# Patient Record
Sex: Male | Born: 1990
Health system: Southern US, Community
[De-identification: ages and names within clinical notes are randomized; demographics above are authoritative.]

## PROBLEM LIST (undated history)

## (undated) DIAGNOSIS — E782 Mixed hyperlipidemia: Secondary | ICD-10-CM

## (undated) DIAGNOSIS — G8929 Other chronic pain: Secondary | ICD-10-CM

## (undated) DIAGNOSIS — J90 Pleural effusion, not elsewhere classified: Secondary | ICD-10-CM

## (undated) DIAGNOSIS — I10 Essential (primary) hypertension: Secondary | ICD-10-CM

## (undated) DIAGNOSIS — R000 Tachycardia, unspecified: Secondary | ICD-10-CM

## (undated) DIAGNOSIS — T7840XA Allergy, unspecified, initial encounter: Secondary | ICD-10-CM

## (undated) DIAGNOSIS — M25512 Pain in left shoulder: Secondary | ICD-10-CM

## (undated) DIAGNOSIS — G4730 Sleep apnea, unspecified: Secondary | ICD-10-CM

## (undated) DIAGNOSIS — M545 Low back pain: Secondary | ICD-10-CM

## (undated) DIAGNOSIS — F909 Attention-deficit hyperactivity disorder, unspecified type: Secondary | ICD-10-CM

## (undated) DIAGNOSIS — E669 Obesity, unspecified: Secondary | ICD-10-CM

## (undated) HISTORY — DX: Allergy, unspecified, initial encounter: T78.40XA

## (undated) HISTORY — DX: Pleural effusion, not elsewhere classified: J90

## (undated) HISTORY — PX: FRACTURE SURGERY: SHX138

## (undated) HISTORY — DX: Low back pain: M54.5

## (undated) HISTORY — DX: Other chronic pain: G89.29

## (undated) HISTORY — DX: Pain in left shoulder: M25.512

## (undated) HISTORY — DX: Mixed hyperlipidemia: E78.2

## (undated) HISTORY — DX: Sleep apnea, unspecified: G47.30

## (undated) HISTORY — DX: Essential (primary) hypertension: I10

## (undated) HISTORY — DX: Obesity, unspecified: E66.9

## (undated) HISTORY — PX: HAND SURGERY: SHX662

## (undated) HISTORY — DX: Tachycardia, unspecified: R00.0

## (undated) HISTORY — DX: Attention-deficit hyperactivity disorder, unspecified type: F90.9

---
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
## 2017-04-13 ENCOUNTER — Encounter (INDEPENDENT_AMBULATORY_CARE_PROVIDER_SITE_OTHER): Payer: Self-pay

## 2017-04-13 ENCOUNTER — Ambulatory Visit (INDEPENDENT_AMBULATORY_CARE_PROVIDER_SITE_OTHER): Payer: 59 | Admitting: Physician Assistant

## 2017-04-13 ENCOUNTER — Encounter: Payer: Self-pay | Admitting: Physician Assistant

## 2017-04-13 VITALS — BP 137/86 | HR 84 | Ht 67.0 in | Wt 199.0 lb

## 2017-04-13 DIAGNOSIS — F909 Attention-deficit hyperactivity disorder, unspecified type: Secondary | ICD-10-CM

## 2017-04-13 DIAGNOSIS — Z0000 Encounter for general adult medical examination without abnormal findings: Secondary | ICD-10-CM

## 2017-04-13 DIAGNOSIS — I10 Essential (primary) hypertension: Secondary | ICD-10-CM

## 2017-04-13 DIAGNOSIS — B079 Viral wart, unspecified: Secondary | ICD-10-CM

## 2017-04-13 DIAGNOSIS — H029 Unspecified disorder of eyelid: Secondary | ICD-10-CM

## 2017-04-13 DIAGNOSIS — E6609 Other obesity due to excess calories: Secondary | ICD-10-CM | POA: Diagnosis not present

## 2017-04-13 DIAGNOSIS — Z7689 Persons encountering health services in other specified circumstances: Secondary | ICD-10-CM

## 2017-04-13 DIAGNOSIS — Z6831 Body mass index (BMI) 31.0-31.9, adult: Secondary | ICD-10-CM

## 2017-04-13 DIAGNOSIS — Z131 Encounter for screening for diabetes mellitus: Secondary | ICD-10-CM

## 2017-04-13 DIAGNOSIS — Z789 Other specified health status: Secondary | ICD-10-CM

## 2017-04-13 DIAGNOSIS — Z113 Encounter for screening for infections with a predominantly sexual mode of transmission: Secondary | ICD-10-CM | POA: Diagnosis not present

## 2017-04-13 DIAGNOSIS — Z130 Encounter for screening for diseases of the blood and blood-forming organs and certain disorders involving the immune mechanism: Secondary | ICD-10-CM | POA: Diagnosis not present

## 2017-04-13 DIAGNOSIS — Z6830 Body mass index (BMI) 30.0-30.9, adult: Secondary | ICD-10-CM

## 2017-04-13 DIAGNOSIS — Z973 Presence of spectacles and contact lenses: Secondary | ICD-10-CM

## 2017-04-13 DIAGNOSIS — Z13220 Encounter for screening for lipoid disorders: Secondary | ICD-10-CM

## 2017-04-13 HISTORY — DX: Attention-deficit hyperactivity disorder, unspecified type: F90.9

## 2017-04-13 HISTORY — DX: Essential (primary) hypertension: I10

## 2017-04-13 LAB — CBC
HEMATOCRIT: 43.3 % (ref 38.5–50.0)
HEMOGLOBIN: 14.4 g/dL (ref 13.2–17.1)
MCH: 29.8 pg (ref 27.0–33.0)
MCHC: 33.3 g/dL (ref 32.0–36.0)
MCV: 89.5 fL (ref 80.0–100.0)
MPV: 9.2 fL (ref 7.5–12.5)
Platelets: 272 10*3/uL (ref 140–400)
RBC: 4.84 MIL/uL (ref 4.20–5.80)
RDW: 13.4 % (ref 11.0–15.0)
WBC: 4.2 10*3/uL (ref 3.8–10.8)

## 2017-04-13 MED ORDER — AMPHETAMINE-DEXTROAMPHETAMINE 20 MG PO TABS: ORAL_TABLET | ORAL | 0 refills | Status: DC

## 2017-04-13 NOTE — Patient Instructions (Signed)
For your blood pressure: - Limit salt to <1500 mg/day - Follow DASH eating plan - regular cardiovascular exercise (below) - limit alcohol to 2 standard drinks per day - avoid tobacco products - weight loss: 7% of current body weight - Follow-up in 6 months  Physical Activity Recommendations for modifying lipids and lowering blood pressure Engage in aerobic physical activity to reduce LDL-cholesterol, non-HDL-cholesterol, and blood pressure  Frequency: 3-4 sessions per week  Intensity: moderate to vigorous  Duration: 40 minutes on average  Physical Activity Recommendations for secondary prevention 1. Aerobic exercise  Frequency: 3-5 sessions per week  Intensity: 50-80% capacity  Duration: 20 - 60 minutes  Examples: walking, treadmill, cycling, rowing, stair climbing, and arm/leg ergometry  2. Resistance exercise  Frequency: 2-3 sessions per week  Intensity: 10-15 repetitions/set to moderate fatigue  Duration: 1-3 sets of 8-10 upper and lower body exercises  Examples: calisthenics, elastic bands, cuff/hand weights, dumbbels, free weights, wall pulleys, and weight machines  Heart-Healthy Lifestyle  Eating a diet rich in vegetables, fruits and whole grains: also includes low-fat dairy products, poultry, fish, legumes, and nuts; limit intake of sweets, sugar-sweetened beverages and red meats  Getting regular exercise  Maintaining a healthy weight  Not smoking or getting help quitting  Staying on top of your health; for some people, lifestyle changes alone may not be enough to prevent a heart attack or stroke. In these cases, taking a statin at the right dose will most likely be necessary

## 2017-04-13 NOTE — Progress Notes (Signed)
HPI:                                                                Stephen Steele is a 26 y.o. male who presents to [facility]: Primary Care Sports Medicine today to establish care / annual physical exam  Current Concerns include warts   Patient reports multiple warts of his right thumb. States they have been present for years and are spreading. He has tried salicylic acid without success. Denies pain, warmth, erythema.  ADHD: doing well with Adderrall immediate release bid. Denies headache, palpitations.  Health Maintenance Health Maintenance  Topic Date Due  . HIV Screening  09/25/2006  . TETANUS/TDAP  09/25/2010  . INFLUENZA VACCINE  03/21/2017    Past Medical History:  Diagnosis Date  . Adult ADHD 04/13/2017  . Allergy   . Chronic left shoulder pain    Past Surgical History:  Procedure Laterality Date  . HAND SURGERY Right    Social History  Substance Use Topics  . Smoking status: Former Smoker    Years: 2.00    Types: Cigarettes    Quit date: 04/13/2012  . Smokeless tobacco: Never Used  . Alcohol use 4.2 oz/week    7 Glasses of wine per week   family history includes Cancer in his paternal grandfather; Depression in his mother and sister; Stroke in his paternal grandfather.  ROS: Review of Systems  Constitutional: Negative.   HENT: Negative.   Eyes: Negative.   Respiratory: Negative.   Cardiovascular: Negative.   Gastrointestinal: Negative.   Genitourinary: Negative.   Musculoskeletal: Positive for myalgias.  Skin:       + viral warts  Endo/Heme/Allergies: Positive for environmental allergies.  Psychiatric/Behavioral: The patient has insomnia.      Medications: Current Outpatient Prescriptions  Medication Sig Dispense Refill  . [START ON 04/23/2017] amphetamine-dextroamphetamine (ADDERALL) 20 MG tablet Take 2 tablets in the morning and 1 tablet at lunch 90 tablet 0  . tiZANidine (ZANAFLEX) 4 MG tablet Take 1 tablet by mouth daily as  needed.    Melene Muller ON 05/23/2017] amphetamine-dextroamphetamine (ADDERALL) 20 MG tablet Take 2 tablets in the morning and 1 tablet at lunch 90 tablet 0  . [START ON 06/22/2017] amphetamine-dextroamphetamine (ADDERALL) 20 MG tablet Take 2 tablets QAM and 1 tablet at lunch 90 tablet 0   No current facility-administered medications for this visit.    No Known Allergies   Objective:  BP 137/86   Pulse 84   Ht 5\' 7"  (1.702 m)   Wt 199 lb (90.3 kg)   BMI 31.17 kg/m  General Appearance:  Alert, cooperative, no distress, appropriate for age                            Head:  Normocephalic, without obvious abnormality                             Eyes:  PERRL, EOM's intact, conjunctiva and cornea clear, wearing contact lenses                             Ears:  Right TM pearly  gray color and semitransparent, left TM slightly retracted, external ear canals normal, both ears                            Nose:  Nares symmetrical                          Throat:  Lips, tongue, and mucosa are moist, pink, and intact; good dentition                             Neck:  Supple; symmetrical, trachea midline, no adenopathy; thyroid: no enlargement, symmetric, no tenderness/mass/nodules                             Back:  Symmetrical, no curvature, ROM normal                                        Lungs:  Clear to auscultation bilaterally, respirations unlabored                             Heart:  regular rate & normal rhythm, S1 and S2 normal, no murmurs, rubs, or gallops                     Abdomen:  Soft, non-tender, no mass or organomegaly              Genitourinary:  deferred         Musculoskeletal:  Tone and strength strong and symmetrical, all extremities; no joint pain or edema, normal gait and station                                  Lymphatic:  No adenopathy             Skin/Hair/Nails:  Skin warm, dry and intact, no rashes or abnormal dyspigmentation on limited exam, right thumb with multiple viral  warts at the distal phalanx, DIP, and lateral nail fold; right upper eye lid with approx 3mm erythematous papule                   Neurologic:  Alert and oriented x3, no cranial nerve deficits, DTR's intact, sensation grossly intact, normal gait and station, no tremor    No results found for this or any previous visit (from the past 72 hour(s)). No results found.  Depression screen PHQ 2/9 04/13/2017  Decreased Interest 1  Down, Depressed, Hopeless 0  PHQ - 2 Score 1     Assessment and Plan: 26 y.o. male with   1. Encounter to establish care - reviewed PMH, PSH, PFH   2. Encounter for annual physical exam - Tdap UTD per patient - negative PHQ2 - CBC - Comprehensive metabolic panel - Lipid Panel w/reflex Direct LDL  3. Routine screening for STI (sexually transmitted infection) - Hepatitis C antibody - HIV antibody - GC/Chlamydia Probe Amp - RPR  4. Class 1 obesity due to excess calories without serious comorbidity with body mass index (BMI) of 31.0 to 31.9 in adult - counseled on heart healthy diet and exercise  5. Screening for diabetes  mellitus - Comprehensive metabolic panel  6. Screening for lipid disorders - Lipid Panel w/reflex Direct LDL  7. Screening, anemia, deficiency, iron - CBC  8. Viral wart on right thumb Cryotherapy template Procedure: Cryodestruction of: viral warts of right thumb Consent obtained and verified. Time-out conducted. Noted no overlying erythema, induration, or other signs of local infection. Completed without difficulty using Cryo-Gun. Advised to call if fevers/chills, erythema, induration, drainage, or persistent bleeding.   9. Hypertension, unspecified type BP Readings from Last 3 Encounters:  04/13/17 137/86  - therapeutic lifestyle changes - closely monitor at ADHD follow-ups   10. Adult ADHD - patient brought in pill bottle which shows fill date of 03/23/17.  - reviewed office policy on controlled substances - 3 month  supply provided beginning 04/23/17 - amphetamine-dextroamphetamine (ADDERALL) 20 MG tablet; Take 2 tablets in the morning and 1 tablet at lunch  Dispense: 90 tablet; Refill: 0 - amphetamine-dextroamphetamine (ADDERALL) 20 MG tablet; Take 2 tablets in the morning and 1 tablet at lunch  Dispense: 90 tablet; Refill: 0 - amphetamine-dextroamphetamine (ADDERALL) 20 MG tablet; Take 2 tablets QAM and 1 tablet at lunch  Dispense: 90 tablet; Refill: 0  11. Wears contact lenses   12. Lesion of upper right eyelid - referral to dermatology  Patient education and anticipatory guidance given Patient agrees with treatment plan Follow-up in 2 weeks for cryotherapy or sooner as needed  Levonne Hubert PA-C

## 2017-04-14 LAB — HIV ANTIBODY (ROUTINE TESTING W REFLEX): HIV: NONREACTIVE

## 2017-04-14 LAB — LIPID PANEL W/REFLEX DIRECT LDL
CHOLESTEROL: 214 mg/dL — AB (ref <200)
HDL: 34 mg/dL — AB (ref >40)
LDL-Cholesterol: 162 mg/dL — ABNORMAL HIGH
Non-HDL Cholesterol (Calc): 180 mg/dL — ABNORMAL HIGH (ref <130)
TRIGLYCERIDES: 81 mg/dL (ref <150)
Total CHOL/HDL Ratio: 6.3 Ratio — ABNORMAL HIGH (ref <5.0)

## 2017-04-14 LAB — COMPREHENSIVE METABOLIC PANEL
ALBUMIN: 4.8 g/dL (ref 3.6–5.1)
ALK PHOS: 62 U/L (ref 40–115)
ALT: 25 U/L (ref 9–46)
AST: 16 U/L (ref 10–40)
BILIRUBIN TOTAL: 0.5 mg/dL (ref 0.2–1.2)
BUN: 15 mg/dL (ref 7–25)
CALCIUM: 9.4 mg/dL (ref 8.6–10.3)
CO2: 21 mmol/L (ref 20–32)
Chloride: 103 mmol/L (ref 98–110)
Creat: 1.22 mg/dL (ref 0.60–1.35)
Glucose, Bld: 87 mg/dL (ref 65–99)
Potassium: 4 mmol/L (ref 3.5–5.3)
Sodium: 139 mmol/L (ref 135–146)
Total Protein: 6.7 g/dL (ref 6.1–8.1)

## 2017-04-14 LAB — RPR

## 2017-04-14 LAB — GC/CHLAMYDIA PROBE AMP
CT PROBE, AMP APTIMA: NOT DETECTED
GC PROBE AMP APTIMA: NOT DETECTED

## 2017-04-14 LAB — HEPATITIS C ANTIBODY: HCV Ab: NONREACTIVE

## 2017-04-16 ENCOUNTER — Encounter: Payer: Self-pay | Admitting: Physician Assistant

## 2017-04-16 DIAGNOSIS — E782 Mixed hyperlipidemia: Secondary | ICD-10-CM | POA: Insufficient documentation

## 2017-04-16 HISTORY — DX: Mixed hyperlipidemia: E78.2

## 2017-04-16 NOTE — Progress Notes (Signed)
LDL cholesterol is high. Recommend low cholesterol diet and regular cardiovascular exercise for the next 3 months. Recheck fasting lipid panel in 3 months.  Other labs look good - negative STI testing - normal blood counts - no evidence of diabetes

## 2017-04-30 MED FILL — DEXTROAMP-AMPHETAMIN 20 MG: 20 | 30 days supply | Qty: 90 | Fill #0

## 2017-05-04 ENCOUNTER — Ambulatory Visit: Payer: 59 | Admitting: Physician Assistant

## 2017-05-15 ENCOUNTER — Telehealth: Payer: Self-pay | Admitting: *Deleted

## 2017-05-15 NOTE — Telephone Encounter (Signed)
Need to know what patient has tried and failed in the past. Even with previous providers progress notes I only see adderall

## 2017-05-17 ENCOUNTER — Telehealth: Payer: Self-pay

## 2017-05-17 NOTE — Telephone Encounter (Signed)
Pre Authorization was sent to Cover My Med[employer].(Key: [id number])

## 2017-05-22 NOTE — Telephone Encounter (Addendum)
Response from the insurance company in regard PA is :the plan only allows for 2 tablets per day. Three tablets a day exceeds their quantity limit rule. In order to have the request approved the provider needs to submit information showing that the larger quantity is clinically necessary for the care and include a statement that the lower quantity was ineffective.  In his progress notes with Dr. Daphene Calamity I see that he has always been on 3 tablets per day on at the 20 mg dose.

## 2017-05-25 NOTE — Telephone Encounter (Signed)
Appeal sent.

## 2017-05-28 NOTE — Telephone Encounter (Addendum)
Appeal has been approved. Notified [facility].

## 2017-06-01 MED FILL — AMPHETAMINE SALTS 20 MG TAB: 20 | 30 days supply | Qty: 90 | Fill #0

## 2017-06-29 MED FILL — DEXTROAMP-AMPHETAMIN 20 MG: 20 | 30 days supply | Qty: 90 | Fill #0

## 2017-07-24 ENCOUNTER — Ambulatory Visit (INDEPENDENT_AMBULATORY_CARE_PROVIDER_SITE_OTHER): Payer: 59 | Admitting: Physician Assistant

## 2017-07-24 ENCOUNTER — Encounter: Payer: Self-pay | Admitting: Physician Assistant

## 2017-07-24 VITALS — BP 117/81 | HR 72 | Wt 199.0 lb

## 2017-07-24 DIAGNOSIS — M545 Low back pain, unspecified: Secondary | ICD-10-CM | POA: Insufficient documentation

## 2017-07-24 DIAGNOSIS — F909 Attention-deficit hyperactivity disorder, unspecified type: Secondary | ICD-10-CM

## 2017-07-24 DIAGNOSIS — R030 Elevated blood-pressure reading, without diagnosis of hypertension: Secondary | ICD-10-CM | POA: Insufficient documentation

## 2017-07-24 DIAGNOSIS — B079 Viral wart, unspecified: Secondary | ICD-10-CM

## 2017-07-24 DIAGNOSIS — Z79899 Other long term (current) drug therapy: Secondary | ICD-10-CM | POA: Diagnosis not present

## 2017-07-24 HISTORY — DX: Low back pain, unspecified: M54.50

## 2017-07-24 MED ORDER — TIZANIDINE HCL 4 MG PO TABS: 4.0000 mg | ORAL_TABLET | Freq: Every day | ORAL | 3 refills | Status: DC | PRN

## 2017-07-24 MED ORDER — AMPHETAMINE-DEXTROAMPHETAMINE 20 MG PO TABS: ORAL_TABLET | ORAL | 0 refills | Status: DC

## 2017-07-24 MED FILL — tiZANidine HCL 4 MG TABS: 4 | 30 days supply | Qty: 30 | Fill #0

## 2017-07-24 NOTE — Patient Instructions (Addendum)
For low back pain: - rehab exercises daily - avoid rest and being sedentary. Try to do as many of your daily activities as possible. Avoid painful activities - practice good body mechanics - TENS unit (muscle stimulation) - muscle relaxer as needed for spasm - Aleve 1-2 tabs twice a day as needed for pain - Ice or Heat (whichever feels better), x 20 minutes 3-4 times daily  For warts: - wait 1 week. Then apply OTC salicylic acid daily - return in 3-4 weeks for repeat cryo as needed

## 2017-07-24 NOTE — Progress Notes (Signed)
HPI:                                                                Stephen Steele is a 26 y.o. male who [facility] today for ADHD follow-up / cryodestruction of warts  Adult ADHD: taking Adderall 60 mg daily in divided doses without difficulty. Denies headache, palpitations, abnormal heart beats, or insomnia.  Warts: underwent cryotherapy 3 months ago in the office. Tolerated procedure well. Reports warts seemed to be improving, but still present. Denies pain, warmth, erythema, or drainage.  LBP: patient reports long-standing history of intermittent bilateral low back pain. Pain is moderate, intermittent. Reports it is worse with standing. Denies history of trauma/injury. He has been taking Zanaflex as needed, which works well for him. Reports he went to PT and chiro in the past. Denies paresthesias, weakness or sciatica. No saddle numbness. No bowel or bladder dysfunction. No constitutional symptoms.  Past Medical History:  Diagnosis Date  . Adult ADHD 04/13/2017  . Allergy   . Chronic left shoulder pain   . Hypertension 04/13/2017  . Mixed hyperlipidemia 04/16/2017  . Obesity    Past Surgical History:  Procedure Laterality Date  . HAND SURGERY Right    Social History   Tobacco Use  . Smoking status: Former Smoker    Years: 2.00    Types: Cigarettes    Last attempt to quit: 04/13/2012    Years since quitting: 5.2  . Smokeless tobacco: Never Used  Substance Use Topics  . Alcohol use: Yes    Alcohol/week: 4.2 oz    Types: 7 Glasses of wine per week   family history includes Cancer in his paternal grandfather; Depression in his mother and sister; Stroke in his paternal grandfather.  ROS: negative except as noted in the HPI  Medications: Current Outpatient Medications  Medication Sig Dispense Refill  . amphetamine-dextroamphetamine (ADDERALL) 20 MG tablet Take 2 tablets in the morning and 1 tablet at lunch 90  tablet 0  . [START ON 08/23/2017] amphetamine-dextroamphetamine (ADDERALL) 20 MG tablet Take 2 tablets in the morning and 1 tablet at lunch 90 tablet 0  . [START ON 09/22/2017] amphetamine-dextroamphetamine (ADDERALL) 20 MG tablet Take 2 tablets QAM and 1 tablet at lunch 90 tablet 0  . tiZANidine (ZANAFLEX) 4 MG tablet Take 1 tablet (4 mg total) by mouth daily as needed. 30 tablet 3   No current facility-administered medications for this visit.    No Known Allergies     Objective:  BP 117/81   Pulse 72   Wt 199 lb (90.3 kg)   BMI 31.17 kg/m  Gen:  alert, not ill-appearing, no distress, appropriate for age, obese male HEENT: head normocephalic without obvious abnormality, conjunctiva and cornea clear, trachea midline Pulm: Normal work of breathing, normal phonation, clear to auscultation bilaterally, no wheezes, rales or rhonchi CV: Normal rate, regular rhythm, s1 and s2 distinct, no murmurs, clicks or rubs  Neuro: alert and oriented x 3, no tremor MSK: extremities atraumatic, normal gait and station Skin: palmar aspect of right distal first phalanx and lateral nail fold with viral wart, right second phalanx at the lateral PIP there is a viral wart Psych: well-groomed, cooperative, good eye contact, euthymic mood, affect mood-congruent,  speech is articulate, and thought processes clear and goal-directed  [facility] 04/13/2017  Decreased Interest 1  Down, Depressed, Hopeless 0  PHQ - 2 Score 1     No results found for this or any previous visit (from the past 72 hour(s)). No results found.    Assessment and Plan: 26 y.o. male with   1. Encounter for medication management in attention deficit hyperactivity disorder (ADHD)   2. Adult ADHD - checked NCCSRS, no red flags. Follow-up in 3 months - amphetamine-dextroamphetamine (ADDERALL) 20 MG tablet; Take 2 tablets in the morning and 1 tablet at lunch  Dispense: 90 tablet; Refill: 0 - amphetamine-dextroamphetamine  (ADDERALL) 20 MG tablet; Take 2 tablets in the morning and 1 tablet at lunch  Dispense: 90 tablet; Refill: 0 - amphetamine-dextroamphetamine (ADDERALL) 20 MG tablet; Take 2 tablets QAM and 1 tablet at lunch  Dispense: 90 tablet; Refill: 0  3. Viral wart on right thumb Procedure: Cryodestruction of: 3 viral warts, right 1st and 2nd digits Consent obtained and verified. Time-out conducted. Noted no overlying erythema, induration, or other signs of local infection. Completed without difficulty using Cryo-Gun. Advised to call if fevers/chills, erythema, induration, drainage, or persistent bleeding. Advised to start daily salicylic acid treatments after 1 week. Return in 3-4 weeks for repeat cryo as needed.  4. Acute bilateral low back pain without sciatica - rehab exercises daily - avoid rest and being sedentary. Try to do as many of your daily activities as possible. Avoid painful activities - practice good body mechanics - TENS unit (muscle stimulation) - muscle relaxer as needed for spasm - Aleve 1-2 tabs twice a day as needed for pain - Ice or Heat (whichever feels better), x 20 minutes 3-4 times daily  5. Elevated blood pressure reading BP Readings from Last 3 Encounters:  07/24/17 117/81  04/13/17 137/86  - initial reading in office hypertensive, diastolic BP has been elevated at last 2 visits - active surveillance - follow-up in 3 months   Patient education and anticipatory guidance given Patient agrees with treatment plan Follow-up in 3 months or sooner as needed if symptoms worsen or fail to improve  Levonne Hubertharley E. Cummings PA-C

## 2017-07-27 MED FILL — DEXTROAMP-AMPHETAMIN 20 MG: 20 | 30 days supply | Qty: 90 | Fill #0

## 2017-08-08 DIAGNOSIS — B078 Other viral warts: Secondary | ICD-10-CM | POA: Diagnosis not present

## 2017-08-28 MED FILL — AMPHETAMINE-DEXTRO 20MG: 20 | 30 days supply | Qty: 90 | Fill #0

## 2017-10-01 MED FILL — tiZANidine HCL 4 MG TABS: 4 | 30 days supply | Qty: 30 | Fill #1

## 2017-10-01 MED FILL — AMPHETAMINE-DEXTRO 20MG: 20 | 30 days supply | Qty: 90 | Fill #0

## 2017-10-17 ENCOUNTER — Telehealth: Payer: 59 | Admitting: Family

## 2017-10-17 DIAGNOSIS — R6889 Other general symptoms and signs: Secondary | ICD-10-CM | POA: Diagnosis not present

## 2017-10-17 MED ORDER — OSELTAMIVIR PHOSPHATE 75 MG PO CAPS: 75.0000 mg | ORAL_CAPSULE | Freq: Two times a day (BID) | ORAL | 0 refills | Status: DC

## 2017-10-17 MED FILL — OSELTAMIVIR PHOSPHATE 75 MG: 75 | 5 days supply | Qty: 10 | Fill #0

## 2017-10-17 NOTE — Progress Notes (Signed)

## 2017-10-23 ENCOUNTER — Ambulatory Visit (INDEPENDENT_AMBULATORY_CARE_PROVIDER_SITE_OTHER): Payer: 59

## 2017-10-23 ENCOUNTER — Ambulatory Visit (INDEPENDENT_AMBULATORY_CARE_PROVIDER_SITE_OTHER): Payer: 59 | Admitting: Physician Assistant

## 2017-10-23 ENCOUNTER — Encounter: Payer: Self-pay | Admitting: Physician Assistant

## 2017-10-23 VITALS — BP 134/92 | HR 91 | Temp 98.4°F | Wt 200.0 lb

## 2017-10-23 DIAGNOSIS — J90 Pleural effusion, not elsewhere classified: Secondary | ICD-10-CM

## 2017-10-23 DIAGNOSIS — R05 Cough: Secondary | ICD-10-CM

## 2017-10-23 DIAGNOSIS — I10 Essential (primary) hypertension: Secondary | ICD-10-CM

## 2017-10-23 DIAGNOSIS — R059 Cough, unspecified: Secondary | ICD-10-CM

## 2017-10-23 DIAGNOSIS — T43605A Adverse effect of unspecified psychostimulants, initial encounter: Secondary | ICD-10-CM | POA: Diagnosis not present

## 2017-10-23 DIAGNOSIS — F909 Attention-deficit hyperactivity disorder, unspecified type: Secondary | ICD-10-CM | POA: Diagnosis not present

## 2017-10-23 NOTE — Patient Instructions (Addendum)
For your blood pressure: - Goal <130/80 - monitor and log blood pressures at home - check around the same time each day in a relaxed setting - reduce morning dose of Adderall to 1.5 tablets (30 mg) and afternoon dose to (10 mg) - limit caffeine 1 standard drink per day - start weaning off of e-cigarettes  - Limit salt to <2000 mg/day - Follow DASH eating plan - limit alcohol to 2 standard drinks per day for men and 1 per day for women - avoid tobacco products - weight loss: 7% of current body weight - send me your BP readings in 1 week

## 2017-10-23 NOTE — Progress Notes (Signed)
HPI:                                                                Stephen Steele is a 27 y.o. male who presents to [facility]: Primary Care Sports Medicine today for medication management  Current concerns: tachycardia  He was diagnosed with flu-like illness on 10/17/17 and treated with Tamiflu at e-visit. Has not had a fever since Friday (4 days ago). Reports when he went to work on Sunday he had lightheadedness, fatigue, and chest tightness. He checked his vitals and reports HR of 127-135 at rest and elevated BP of 154/99 and SpO2 was 100%. He had taken his Adderall that morning, but held his second dose. He also drinks multiple caffeinated beverages and uses e-cigarettes at least once per day He is feeling improved today. Denies palpitations, irregular heart beats, or chest pain. Denies hemoptysis, dyspnea, shortness of breath.  ADHD: currently takes 40 mg of adderall every morning and 20 mg in the afternoon as needed. States XR did not kick in quickly enough.   Depression screen PHQ 2/9 04/13/2017  Decreased Interest 1  Down, Depressed, Hopeless 0  PHQ - 2 Score 1    No flowsheet data found.    Past Medical History:  Diagnosis Date  . Adult ADHD 04/13/2017  . Allergy   . Chronic left shoulder pain   . Hypertension 04/13/2017  . Mixed hyperlipidemia 04/16/2017  . Obesity    Past Surgical History:  Procedure Laterality Date  . HAND SURGERY Right    Social History   Tobacco Use  . Smoking status: Former Smoker    Years: 2.00    Types: Cigarettes    Last attempt to quit: 04/13/2012    Years since quitting: 5.5  . Smokeless tobacco: Never Used  Substance Use Topics  . Alcohol use: Yes    Alcohol/week: 4.2 oz    Types: 7 Glasses of wine per week   family history includes Cancer in his paternal grandfather; Depression in his mother and sister; Stroke in his paternal grandfather.    ROS: negative except as noted in the HPI  Medications: Current  Outpatient Medications  Medication Sig Dispense Refill  . amphetamine-dextroamphetamine (ADDERALL) 20 MG tablet Take 2 tablets in the morning and 1 tablet at lunch 90 tablet 0  . amphetamine-dextroamphetamine (ADDERALL) 20 MG tablet Take 2 tablets in the morning and 1 tablet at lunch 90 tablet 0  . amphetamine-dextroamphetamine (ADDERALL) 20 MG tablet Take 2 tablets QAM and 1 tablet at lunch 90 tablet 0   No current facility-administered medications for this visit.    No Known Allergies     Objective:  BP (!) 134/92   Pulse 91   Temp 98.4 F (36.9 C)   Wt 200 lb (90.7 kg)   SpO2 99%   BMI 31.32 kg/m  Gen:  alert, not ill-appearing, no distress, appropriate for age, obese male HEENT: head normocephalic without obvious abnormality, conjunctiva and cornea clear, trachea midline Pulm: Normal work of breathing, normal phonation, clear to auscultation bilaterally, no wheezes, rales or rhonchi CV: mildly tachycardic, regular rhythm, s1 and s2 distinct, no murmurs, clicks or rubs  Neuro: alert and oriented x 3, no tremor MSK: extremities atraumatic, normal gait and station Skin:  intact, no rashes on exposed skin, no jaundice, no cyanosis Psych: well-groomed, cooperative, good eye contact, euthymic mood, affect mood-congruent, speech is articulate, and thought processes clear and goal-directed    No results found for this or any previous visit (from the past 72 hour(s)). Dg Chest 2 View  Result Date: 10/23/2017 CLINICAL DATA:  Cough with recent flu syndrome 1 week ago. Former smoker. EXAM: CHEST - 2 VIEW COMPARISON:  None in PACs FINDINGS: The lungs are mildly hyperinflated. There is no focal infiltrate. There is a trace of pleural fluid on the right. The heart and pulmonary vascularity are normal. The mediastinum is normal in width. The bony thorax is unremarkable. IMPRESSION: Tiny right-sided pleural effusion.  No acute pneumonia. Electronically Signed   By: David  SwazilandJordan M.D.   On:  10/23/2017 16:58      Assessment and Plan: 27 y.o. male with   1. Adult ADHD - holding his Adderall refill due to tachycardia and hypertension. We will likely need to reduce his TDD to 40 mg and if BP remains elevated he will need to either stop stimulant or start antihypertensive medication. He prefers to start antihypertensive because he needs stimulant to function at his job  2. Cough - afebrile, mild tachycardia, SpO2 99-100%. He is feeling improved, but concerned about possible pneumonia. - DG Chest 2 View to assess for infiltrate  3. Hypertension goal BP (blood pressure) < 130/80, Tachycardia BP Readings from Last 3 Encounters:  10/23/17 (!) 134/92  07/24/17 117/81  04/13/17 137/86  - we discussed that patient's symptoms are likely the result of multiple stimulants in the setting of a febrile illness (adderall, nicotine and caffeine). I encouraged him to limit caffeine to 1 standard drink per day and start weaning off of e-cigarettes. He will reduce his Adderall dose to 1.5 tablets QAM and 1/2 tablet in the afternoon. No refills until we have follow-up BP readings - he will monitor BP at home and send readings via MyChart (cannot follow-up in person due to work schedule) - counseled on therapeutic lifestyle changes  4. Adverse effect of central nervous system stimulant, initial encounter - tachycardia due to combination of Adderall, nicotine and caffeine   Patient education and anticipatory guidance given Patient agrees with treatment plan Follow-up as needed if symptoms worsen or fail to improve  Levonne Hubertharley E. Christiana Gurevich PA-C

## 2017-10-25 ENCOUNTER — Encounter: Payer: Self-pay | Admitting: Physician Assistant

## 2017-10-25 ENCOUNTER — Other Ambulatory Visit: Payer: Self-pay | Admitting: Physician Assistant

## 2017-10-25 DIAGNOSIS — R05 Cough: Secondary | ICD-10-CM

## 2017-10-25 DIAGNOSIS — J90 Pleural effusion, not elsewhere classified: Secondary | ICD-10-CM

## 2017-10-25 DIAGNOSIS — R053 Chronic cough: Secondary | ICD-10-CM

## 2017-10-25 HISTORY — DX: Pleural effusion, not elsewhere classified: J90

## 2017-10-25 NOTE — Progress Notes (Signed)
Stephen Steele,  Your x-ray shows a tiny bit of fluid on your lung. I would recommend we recheck this in 1-2 weeks to assure it has resolved. I also would like to check a d-dimer to rule out a blood clot in your lung. I think you are low risk, but with your history of chest tightness, cough, and elevated heart rate we should make sure.  Vinetta Bergamoharley

## 2017-10-25 NOTE — Addendum Note (Signed)
Addended by: Gena FrayUMMINGS, Trena Dunavan E on: 10/25/2017 12:58 PM   Modules accepted: Orders

## 2017-10-31 ENCOUNTER — Ambulatory Visit (INDEPENDENT_AMBULATORY_CARE_PROVIDER_SITE_OTHER): Payer: 59

## 2017-10-31 ENCOUNTER — Other Ambulatory Visit: Payer: Self-pay | Admitting: Physician Assistant

## 2017-10-31 DIAGNOSIS — J90 Pleural effusion, not elsewhere classified: Secondary | ICD-10-CM

## 2017-10-31 DIAGNOSIS — I10 Essential (primary) hypertension: Secondary | ICD-10-CM

## 2017-10-31 DIAGNOSIS — J111 Influenza due to unidentified influenza virus with other respiratory manifestations: Secondary | ICD-10-CM

## 2017-10-31 DIAGNOSIS — R918 Other nonspecific abnormal finding of lung field: Secondary | ICD-10-CM

## 2017-10-31 DIAGNOSIS — F909 Attention-deficit hyperactivity disorder, unspecified type: Secondary | ICD-10-CM

## 2017-10-31 MED ORDER — LISINOPRIL 10 MG PO TABS: 10.0000 mg | ORAL_TABLET | Freq: Every day | ORAL | 3 refills | Status: DC

## 2017-10-31 MED ORDER — IOPAMIDOL (ISOVUE-300) INJECTION 61%
100.0000 mL | Freq: Once | INTRAVENOUS | Status: AC | PRN
  Administered 2017-10-31: 80 mL via INTRAVENOUS

## 2017-10-31 MED ORDER — AMPHETAMINE-DEXTROAMPHETAMINE 20 MG PO TABS: 20.0000 mg | ORAL_TABLET | Freq: Two times a day (BID) | ORAL | 0 refills | Status: DC

## 2017-10-31 MED FILL — LISINOPRIL 10 MG TABS: 10 | 30 days supply | Qty: 30 | Fill #0

## 2017-10-31 MED FILL — DEXTROAMP-AMPHETAMIN 20 MG: 20 | 30 days supply | Qty: 60 | Fill #0

## 2017-10-31 NOTE — Assessment & Plan Note (Signed)
Starting Lisinopril 10 mg and decreasing TDD of Adderall to 40 mg (20 mg IR bid) 10/31/17

## 2017-11-01 NOTE — Progress Notes (Signed)
Alex,  Your CT scan looks good. There are 2 tiny pulmonary nodules that are considered benign and do not require follow-up imaging.  I hope you are feeling better! Vinetta Bergamoharley

## 2017-11-08 ENCOUNTER — Encounter: Payer: Self-pay | Admitting: Physician Assistant

## 2017-11-08 DIAGNOSIS — R42 Dizziness and giddiness: Secondary | ICD-10-CM | POA: Insufficient documentation

## 2017-11-09 ENCOUNTER — Ambulatory Visit: Payer: Self-pay | Admitting: Physician Assistant

## 2017-11-29 ENCOUNTER — Other Ambulatory Visit: Payer: Self-pay | Admitting: Physician Assistant

## 2017-11-29 DIAGNOSIS — F909 Attention-deficit hyperactivity disorder, unspecified type: Secondary | ICD-10-CM

## 2017-11-29 MED FILL — LISINOPRIL 10 MG TABS: 10 | 30 days supply | Qty: 30 | Fill #1

## 2017-11-29 MED FILL — tiZANidine HCL 4 MG TABS: 4 | 30 days supply | Qty: 30 | Fill #2

## 2017-11-30 NOTE — Telephone Encounter (Signed)
[facility] sent request for pt's Adderral 20mg , 1 tab BID.  Last RX sent in 10-31-17 for #[id number].  Please review and send if appropriate.   Thanks!

## 2017-12-04 MED ORDER — AMPHETAMINE-DEXTROAMPHETAMINE 20 MG PO TABS: 20.0000 mg | ORAL_TABLET | Freq: Two times a day (BID) | ORAL | 0 refills | Status: DC

## 2017-12-05 MED FILL — DEXTROAMP-AMPHETAMIN 20 MG: 20 | 30 days supply | Qty: 60 | Fill #0

## 2017-12-31 MED FILL — LISINOPRIL 10 MG TABS: 10 | 30 days supply | Qty: 30 | Fill #2

## 2018-01-02 ENCOUNTER — Ambulatory Visit (INDEPENDENT_AMBULATORY_CARE_PROVIDER_SITE_OTHER): Payer: 59 | Admitting: Physician Assistant

## 2018-01-02 ENCOUNTER — Encounter: Payer: Self-pay | Admitting: Physician Assistant

## 2018-01-02 VITALS — BP 133/88 | HR 108 | Wt 197.0 lb

## 2018-01-02 DIAGNOSIS — Z6831 Body mass index (BMI) 31.0-31.9, adult: Secondary | ICD-10-CM

## 2018-01-02 DIAGNOSIS — F909 Attention-deficit hyperactivity disorder, unspecified type: Secondary | ICD-10-CM | POA: Diagnosis not present

## 2018-01-02 DIAGNOSIS — Z130 Encounter for screening for diseases of the blood and blood-forming organs and certain disorders involving the immune mechanism: Secondary | ICD-10-CM | POA: Diagnosis not present

## 2018-01-02 DIAGNOSIS — Z1329 Encounter for screening for other suspected endocrine disorder: Secondary | ICD-10-CM

## 2018-01-02 DIAGNOSIS — R0683 Snoring: Secondary | ICD-10-CM | POA: Diagnosis not present

## 2018-01-02 DIAGNOSIS — G4719 Other hypersomnia: Secondary | ICD-10-CM | POA: Diagnosis not present

## 2018-01-02 DIAGNOSIS — Z0000 Encounter for general adult medical examination without abnormal findings: Secondary | ICD-10-CM

## 2018-01-02 DIAGNOSIS — E6609 Other obesity due to excess calories: Secondary | ICD-10-CM | POA: Diagnosis not present

## 2018-01-02 DIAGNOSIS — R000 Tachycardia, unspecified: Secondary | ICD-10-CM | POA: Diagnosis not present

## 2018-01-02 DIAGNOSIS — Z6830 Body mass index (BMI) 30.0-30.9, adult: Secondary | ICD-10-CM

## 2018-01-02 DIAGNOSIS — I10 Essential (primary) hypertension: Secondary | ICD-10-CM

## 2018-01-02 DIAGNOSIS — E782 Mixed hyperlipidemia: Secondary | ICD-10-CM

## 2018-01-02 HISTORY — DX: Tachycardia, unspecified: R00.0

## 2018-01-02 MED ORDER — AMPHETAMINE-DEXTROAMPHETAMINE 20 MG PO TABS: 20.0000 mg | ORAL_TABLET | Freq: Two times a day (BID) | ORAL | 0 refills | Status: DC

## 2018-01-02 MED ORDER — LISINOPRIL 20 MG PO TABS: 20.0000 mg | ORAL_TABLET | Freq: Every day | ORAL | 1 refills | Status: DC

## 2018-01-02 NOTE — Patient Instructions (Addendum)
For your blood pressure: - Goal <130/80 - increase Lisinopril to 20 mg daily - reduce Adderall to 20 mg twice a day. Continue to avoid alcohol - monitor and log blood pressures at home - check around the same time each day in a relaxed setting - Limit salt to <2000 mg/day - Follow DASH eating plan - limit alcohol to 2 standard drinks per day for men and 1 per day for women - avoid tobacco products - weight loss: 7% of current body weight - follow-up every 6 months for your blood pressure    Physical Activity Recommendations for modifying lipids and lowering blood pressure Engage in aerobic physical activity to reduce LDL-cholesterol, non-HDL-cholesterol, and blood pressure  Frequency: 3-4 sessions per week  Intensity: moderate to vigorous  Duration: 40 minutes on average  Physical Activity Recommendations for secondary prevention 1. Aerobic exercise  Frequency: 3-5 sessions per week  Intensity: 50-80% capacity  Duration: 20 - 60 minutes  Examples: walking, treadmill, cycling, rowing, stair climbing, and arm/leg ergometry  2. Resistance exercise  Frequency: 2-3 sessions per week  Intensity: 10-15 repetitions/set to moderate fatigue  Duration: 1-3 sets of 8-10 upper and lower body exercises  Examples: calisthenics, elastic bands, cuff/hand weights, dumbbels, free weights, wall pulleys, and weight machines  Heart-Healthy Lifestyle  Eating a diet rich in vegetables, fruits and whole grains: also includes low-fat dairy products, poultry, fish, legumes, and nuts; limit intake of sweets, sugar-sweetened beverages and red meats  Getting regular exercise  Maintaining a healthy weight  Not smoking or getting help quitting  Staying on top of your health; for some people, lifestyle changes alone may not be enough to prevent a heart attack or stroke. In these cases, taking a statin at the right dose will most likely be necessary

## 2018-01-02 NOTE — Progress Notes (Signed)
HPI:                                                                Stephen Steele is a 27 y.o. male who presents to [facility]: Primary Care Sports Medicine today for annual physical exam  HTN: taking Lisinopril 10 mg daily. Compliant with medications. Checks BP's at home. BP range 120's/80's, HR 90's. Denies vision change, headache, chest pain with exertion, orthopnea, lightheadedness, syncope and edema. Risk factors include: male sex, HLD  Also reports excessive daytime sleepiness, snoring, restlessness, non-restorative sleep. This has been going on for years, unchanged. He usually sleeps 8 hours nightly. Denies difficulty falling asleep, nighttime awakening, or premature awakening.  Depression screen PHQ 2/9 04/13/2017  Decreased Interest 1  Down, Depressed, Hopeless 0  PHQ - 2 Score 1    No flowsheet data found.    Past Medical History:  Diagnosis Date  . Adult ADHD 04/13/2017  . Allergy   . Chronic left shoulder pain   . Hypertension 04/13/2017  . Mixed hyperlipidemia 04/16/2017  . Obesity   . Pleural effusion on right 10/25/2017   Past Surgical History:  Procedure Laterality Date  . HAND SURGERY Right    Social History   Tobacco Use  . Smoking status: Former Smoker    Years: 2.00    Types: Cigarettes    Last attempt to quit: 04/13/2012    Years since quitting: 5.7  . Smokeless tobacco: Never Used  Substance Use Topics  . Alcohol use: Yes    Alcohol/week: 4.2 oz    Types: 7 Glasses of wine per week   family history includes Cancer in his paternal grandfather; Depression in his mother and sister; Heart attack in his paternal grandfather; Hypercholesterolemia in his father; Stroke in his paternal grandfather.    ROS: Review of Systems  Constitutional: Positive for malaise/fatigue.  All other systems reviewed and are negative.    Medications: Current Outpatient Medications  Medication Sig Dispense Refill  . amphetamine-dextroamphetamine  (ADDERALL) 20 MG tablet Take 1 tablet (20 mg total) by mouth 2 (two) times daily. 60 tablet 0  . lisinopril (PRINIVIL,ZESTRIL) 10 MG tablet Take 1 tablet (10 mg total) by mouth daily. 30 tablet 3   No current facility-administered medications for this visit.    No Known Allergies     Objective:  BP 133/88   Pulse (!) 108   Wt 197 lb (89.4 kg)   BMI 30.85 kg/m  General Appearance:  Alert, cooperative, no distress, appropriate for age, obese male                            Head:  Normocephalic, without obvious abnormality                             Eyes:  PERRL, EOM's intact, conjunctiva and cornea clear, wearing contact lenses                             Ears:  TM pearly gray color and semitransparent on left side, right TM with scarring and small amount of cerumen sitting on the TM, external  ear canals normal, both ears                            Nose:  Nares symmetrical                          Throat:  Lips, tongue, and mucosa are moist, pink, and intact; good dentition                             Neck:  Supple; symmetrical, trachea midline, no adenopathy; thyroid: no enlargement, symmetric, no tenderness/mass/nodules                             Back:  Symmetrical, no curvature, ROM normal                           Lungs:  Clear to auscultation bilaterally, respirations unlabored                             Heart:  Tachycardic rate & normal rhythm, S1 and S2 normal, no murmurs, rubs, or gallops                     Abdomen:  Soft, non-tender, no mass or organomegaly              Genitourinary:  deferred         Musculoskeletal:  Tone and strength strong and symmetrical, all extremities; no joint pain or edema, normal gait and station                                     Lymphatic:  No adenopathy             Skin/Hair/Nails:  Skin warm, dry and intact, no rashes or abnormal dyspigmentation on limited exam                   Neurologic:  Alert and oriented x3, no cranial nerve deficits,  DTR's intact, sensation grossly intact, normal gait and station, no tremor Psych: well-groomed, cooperative, good eye contact, euthymic mood, affect mood-congruent, speech is articulate, and thought processes clear and goal-directed    No results found for this or any previous visit (from the past 72 hour(s)). No results found.    Assessment and Plan: 27 y.o. male with   Encounter for annual physical exam - Plan: CBC, Comprehensive metabolic panel, Lipid Panel w/reflex Direct LDL  Adult ADHD - Plan: amphetamine-dextroamphetamine (ADDERALL) 20 MG tablet  Hypertension goal BP (blood pressure) < 130/80 - Plan: lisinopril (PRINIVIL,ZESTRIL) 20 MG tablet, Comprehensive metabolic panel  Mixed hyperlipidemia - Plan: Lipid Panel w/reflex Direct LDL  Screening for blood disease - Plan: CBC, Comprehensive metabolic panel  Class 1 obesity due to excess calories without serious comorbidity with body mass index (BMI) of 31.0 to 31.9 in adult  Tachycardia with heart rate 100-120 beats per minute  Screening for thyroid disorder - Plan: TSH + free T4  Snoring  Class 1 obesity due to excess calories with serious comorbidity and body mass index (BMI) of 30.0 to 30.9 in adult  - Personally reviewed PMH, PSH, PFH, medications, allergies, HM -  Age-appropriate cancer screening: n/a - Influenza n/a - Tdap UTD per patient - PHQ2 negative - Counseled on weight loss through decreased caloric intake and increased aerobic exercise - Increasing Lisinopril to 20 mg daily - Tachycardia: reducing Adderall to max dose of 40 mg daily (20 mg bid), avoid other stimulants including caffeine - Positive STOPBANG, moderate risk, declines sleep study today - Fasting labs pending   Patient education and anticipatory guidance given Patient agrees with treatment plan Follow-up in 6 months for medication management or sooner as needed if symptoms worsen or fail to improve  Levonne Hubert PA-C

## 2018-01-03 LAB — LIPID PANEL W/REFLEX DIRECT LDL
Cholesterol: 232 mg/dL — ABNORMAL HIGH (ref <200)
HDL: 34 mg/dL — ABNORMAL LOW (ref >40)
LDL Cholesterol (Calc): 172 mg/dL (calc) — ABNORMAL HIGH
NON-HDL CHOLESTEROL (CALC): 198 mg/dL — AB (ref <130)
Total CHOL/HDL Ratio: 6.8 (calc) — ABNORMAL HIGH (ref <5.0)
Triglycerides: 129 mg/dL (ref <150)

## 2018-01-03 LAB — COMPREHENSIVE METABOLIC PANEL
AG RATIO: 2.2 (calc) (ref 1.0–2.5)
ALKALINE PHOSPHATASE (APISO): 58 U/L (ref 40–115)
ALT: 25 U/L (ref 9–46)
AST: 16 U/L (ref 10–40)
Albumin: 4.9 g/dL (ref 3.6–5.1)
BILIRUBIN TOTAL: 0.7 mg/dL (ref 0.2–1.2)
BUN: 15 mg/dL (ref 7–25)
CALCIUM: 9.5 mg/dL (ref 8.6–10.3)
CHLORIDE: 101 mmol/L (ref 98–110)
CO2: 26 mmol/L (ref 20–32)
Creat: 1.04 mg/dL (ref 0.60–1.35)
GLOBULIN: 2.2 g/dL (ref 1.9–3.7)
GLUCOSE: 91 mg/dL (ref 65–99)
Potassium: 4.1 mmol/L (ref 3.5–5.3)
Sodium: 138 mmol/L (ref 135–146)
Total Protein: 7.1 g/dL (ref 6.1–8.1)

## 2018-01-03 LAB — CBC
HCT: 40.8 % (ref 38.5–50.0)
Hemoglobin: 14 g/dL (ref 13.2–17.1)
MCH: 30.2 pg (ref 27.0–33.0)
MCHC: 34.3 g/dL (ref 32.0–36.0)
MCV: 88.1 fL (ref 80.0–100.0)
MPV: 9.6 fL (ref 7.5–12.5)
PLATELETS: 279 10*3/uL (ref 140–400)
RBC: 4.63 10*6/uL (ref 4.20–5.80)
RDW: 13 % (ref 11.0–15.0)
WBC: 5.1 10*3/uL (ref 3.8–10.8)

## 2018-01-03 LAB — TSH+FREE T4: TSH W/REFLEX TO FT4: 0.75 mIU/L (ref 0.40–4.50)

## 2018-01-04 NOTE — Progress Notes (Signed)
Hi Stephen Steele,  Your cholesterol (total and LDL) have increased. Your LDL is above 160; this makes you a candidate for statin therapy. Would also recommend Mediterranean diet, regular aerobic exercise, and weight loss to help get this down.  Otherwise, your other labs look great - normal kidney function and electrolytes - normal blood counts - no evidence of diabetes - no evidence of thyroid disease  Have a nice weekend! [name]

## 2018-01-09 MED ORDER — ATORVASTATIN CALCIUM 20 MG PO TABS: 20.0000 mg | ORAL_TABLET | Freq: Every day | ORAL | 1 refills | Status: DC

## 2018-01-09 MED FILL — ATORVASTATIN 20 MG TABLET: 20 | 90 days supply | Qty: 90 | Fill #0

## 2018-01-09 NOTE — Addendum Note (Signed)
Addended by: Gena Fray E on: 01/09/2018 04:54 PM   Modules accepted: Orders

## 2018-01-10 ENCOUNTER — Other Ambulatory Visit: Payer: Self-pay | Admitting: Physician Assistant

## 2018-01-10 DIAGNOSIS — F909 Attention-deficit hyperactivity disorder, unspecified type: Secondary | ICD-10-CM

## 2018-01-10 MED FILL — DEXTROAMP-AMPHETAMIN 20 MG: 20 | 30 days supply | Qty: 60 | Fill #0

## 2018-01-10 MED FILL — LISINOPRIL 20 MG TABLET: 20 | 90 days supply | Qty: 90 | Fill #0

## 2018-02-11 ENCOUNTER — Other Ambulatory Visit: Payer: Self-pay | Admitting: Physician Assistant

## 2018-02-11 DIAGNOSIS — F909 Attention-deficit hyperactivity disorder, unspecified type: Secondary | ICD-10-CM

## 2018-02-11 MED FILL — DEXTROAMP-AMPHETAMIN 20 MG: 20 | 30 days supply | Qty: 60 | Fill #0

## 2018-02-12 NOTE — Telephone Encounter (Signed)
This was refilled yesterday.

## 2018-03-11 ENCOUNTER — Other Ambulatory Visit: Payer: Self-pay | Admitting: Physician Assistant

## 2018-03-11 DIAGNOSIS — F909 Attention-deficit hyperactivity disorder, unspecified type: Secondary | ICD-10-CM

## 2018-03-13 ENCOUNTER — Other Ambulatory Visit: Payer: Self-pay | Admitting: Physician Assistant

## 2018-03-13 DIAGNOSIS — F909 Attention-deficit hyperactivity disorder, unspecified type: Secondary | ICD-10-CM

## 2018-03-13 NOTE — Telephone Encounter (Signed)
Last OV was 01/02/2018. No follow up on file. Please advise.

## 2018-03-14 MED FILL — DEXTROAMP-AMPHETAMIN 20 MG: 20 | 30 days supply | Qty: 60 | Fill #0

## 2018-04-08 ENCOUNTER — Ambulatory Visit (INDEPENDENT_AMBULATORY_CARE_PROVIDER_SITE_OTHER): Payer: 59 | Admitting: Physician Assistant

## 2018-04-08 ENCOUNTER — Encounter: Payer: Self-pay | Admitting: Physician Assistant

## 2018-04-08 VITALS — BP 124/78 | HR 94 | Wt 198.0 lb

## 2018-04-08 DIAGNOSIS — F909 Attention-deficit hyperactivity disorder, unspecified type: Secondary | ICD-10-CM

## 2018-04-08 DIAGNOSIS — E782 Mixed hyperlipidemia: Secondary | ICD-10-CM | POA: Diagnosis not present

## 2018-04-08 DIAGNOSIS — G4733 Obstructive sleep apnea (adult) (pediatric): Secondary | ICD-10-CM

## 2018-04-08 DIAGNOSIS — Z9189 Other specified personal risk factors, not elsewhere classified: Secondary | ICD-10-CM

## 2018-04-08 DIAGNOSIS — J309 Allergic rhinitis, unspecified: Secondary | ICD-10-CM | POA: Diagnosis not present

## 2018-04-08 DIAGNOSIS — M545 Low back pain, unspecified: Secondary | ICD-10-CM

## 2018-04-08 DIAGNOSIS — I10 Essential (primary) hypertension: Secondary | ICD-10-CM

## 2018-04-08 DIAGNOSIS — R0683 Snoring: Secondary | ICD-10-CM | POA: Diagnosis not present

## 2018-04-08 DIAGNOSIS — R0982 Postnasal drip: Secondary | ICD-10-CM

## 2018-04-08 DIAGNOSIS — Z79899 Other long term (current) drug therapy: Secondary | ICD-10-CM | POA: Diagnosis not present

## 2018-04-08 MED ORDER — AMPHETAMINE-DEXTROAMPHETAMINE 20 MG PO TABS: 20.0000 mg | ORAL_TABLET | Freq: Two times a day (BID) | ORAL | 0 refills | Status: DC

## 2018-04-08 MED ORDER — LISINOPRIL 20 MG PO TABS: 20.0000 mg | ORAL_TABLET | Freq: Every day | ORAL | 1 refills | Status: DC

## 2018-04-08 MED ORDER — TIZANIDINE HCL 4 MG PO TABS: 4.0000 mg | ORAL_TABLET | Freq: Every day | ORAL | 3 refills | Status: DC | PRN

## 2018-04-08 MED ORDER — MONTELUKAST SODIUM 10 MG PO TABS: 10.0000 mg | ORAL_TABLET | Freq: Every day | ORAL | 3 refills | Status: DC

## 2018-04-08 MED ORDER — FLUTICASONE PROPIONATE 50 MCG/ACT NA SUSP: 1.0000 | Freq: Every day | NASAL | 3 refills | Status: DC

## 2018-04-08 MED ORDER — ATORVASTATIN CALCIUM 20 MG PO TABS: 20.0000 mg | ORAL_TABLET | Freq: Every day | ORAL | 1 refills | Status: DC

## 2018-04-08 MED FILL — ATORVASTATIN CALCIUM 20 MG: 20 | 90 days supply | Qty: 90 | Fill #0

## 2018-04-08 MED FILL — MONTELUKAST SOD 10 MG TAB: 10 | 90 days supply | Qty: 90 | Fill #0

## 2018-04-08 MED FILL — FLUTICASONE PROP 50 MCG SPR: 50 | 60 days supply | Qty: 16 | Fill #0

## 2018-04-08 MED FILL — tiZANidine HCL 4 MG TABS: 4 | 30 days supply | Qty: 30 | Fill #0

## 2018-04-08 MED FILL — LISINOPRIL 20 MG TABLET: 20 | 90 days supply | Qty: 90 | Fill #0

## 2018-04-08 NOTE — Patient Instructions (Signed)
For nasal symptoms/PND - continue antihistamine like Zyrtec, Xyzal or Allegra daily (pick one) - use nasal saline irrigation 1-2 times per day followed by intranasal steroid 1 spray each nostril - start Montelukast at bedtime in addition to your antihistamine - okay to continue Benadryl or Chlorpheneramine at bedtime as needed, but pick one - reduce vaping and avoid all nicotine products if possible

## 2018-04-08 NOTE — Progress Notes (Signed)
HPI:                                                                Stephen SneddonBrighton Steele is a 27 y.o. male who [facility] today for medication management  HTN: taking Lisinopril 20 mg daily, increased from 10 mg 3 months ago. Compliant with medications. Does not check BP's at home. Has reduced caffeine to 1 energy drink per day and has reduced vaping to 1-2 times per day. Denies vision change, headache, chest pain with exertion, orthopnea, lightheadedness, syncope and edema. Risk factors include: male sex, HLD  HLD: taking Lipitor 20 mg daily. Compliant with medications. Denies myalgia or GI upset.  ADHD: doing well on Adderall 20 mg twice a day. Denies headache, palpitations, chest pain.  Reports he is sleeping approximately 8 hours per night and never feels rested. Usual bedtime 10 pm and wake time is 5 am. Sleep partner reports snoring. Denies pauses in breathing or PND. This is a chronic problem for years, gradually worsening. Denies difficulty falling asleep, nighttime awakening, or premature awakening.  Reports chronic nasal congestion and post-nasal drip, worse in the morning. Currently takes multiple OTC antihistamines. Has tried a nasal spray, but felt like it worsened the post-nasal drip.  Requests refill of Zanaflex for recurrent low back pain. Pain is mild, intermittent. Denies constitutional symptoms, saddle numbness, bowel/bladder dysfunction or radicular symptoms.  [facility] 2/9 01/02/2018 04/13/2017  Decreased Interest 0 1  Down, Depressed, Hopeless 0 0  PHQ - 2 Score 0 1    No flowsheet data found.    Past Medical History:  Diagnosis Date  . Adult ADHD 04/13/2017  . Allergy   . Chronic left shoulder pain   . Hypertension 04/13/2017  . Mixed hyperlipidemia 04/16/2017  . Obesity   . Pleural effusion on right 10/25/2017   Past Surgical History:  Procedure Laterality Date  . HAND SURGERY Right     Social History   Tobacco Use  . Smoking status: Former Smoker    Years: 2.00    Types: Cigarettes    Last attempt to quit: 04/13/2012    Years since quitting: 5.9  . Smokeless tobacco: Never Used  Substance Use Topics  . Alcohol use: Yes    Alcohol/week: 7.0 standard drinks    Types: 7 Glasses of wine per week   family history includes Cancer in his paternal grandfather; Depression in his mother and sister; Heart attack in his paternal grandfather; Hypercholesterolemia in his father; Stroke in his paternal grandfather.    ROS: negative except as noted in the HPI  Medications: Current Outpatient Medications  Medication Sig Dispense Refill  . amphetamine-dextroamphetamine (ADDERALL) 20 MG tablet TAKE 1 TABLET BY MOUTH TWICE DAILY 60 tablet 0  . atorvastatin (LIPITOR) 20 MG tablet Take 1 tablet (20 mg total) by mouth at bedtime. 90 tablet 1  . lisinopril (PRINIVIL,ZESTRIL) 20 MG tablet Take 1 tablet (20 mg total) by mouth daily. 90 tablet 1   No current facility-administered medications for this visit.    No Known Allergies     Objective:  BP 124/78   Pulse 94   Wt 198 lb (89.8 kg)   BMI 31.01 kg/m  Gen:  alert, not ill-appearing,  no distress, appropriate for age, obese male HEENT: head normocephalic without obvious abnormality, conjunctiva and cornea clear, TM's pearly gray and semi-transparent, nasal mucosa edematous, oropharynx clear, no cervical adenopathy, trachea midline Pulm: Normal work of breathing, normal phonation, clear to auscultation bilaterally, no wheezes, rales or rhonchi CV: Normal rate, regular rhythm, s1 and s2 distinct, no murmurs, clicks or rubs  Neuro: alert and oriented x 3, no tremor MSK: extremities atraumatic, normal gait and station Skin: intact, no rashes on exposed skin, no jaundice, no cyanosis Psych: well-groomed, cooperative, good eye contact, euthymic mood, affect mood-congruent, speech is articulate, and thought processes clear and  goal-directed    No results found for this or any previous visit (from the past 72 hour(s)). No results found.    Assessment and Plan: 27 y.o. male with   .Stephen Steele was seen today for medication refill.  Diagnoses and all orders for this visit:  Encounter for medication management in attention deficit hyperactivity disorder (ADHD)  Adult ADHD -     amphetamine-dextroamphetamine (ADDERALL) 20 MG tablet; Take 1 tablet (20 mg total) by mouth 2 (two) times daily.  Mixed hyperlipidemia -     atorvastatin (LIPITOR) 20 MG tablet; Take 1 tablet (20 mg total) by mouth at bedtime.  Hypertension goal BP (blood pressure) < 130/80 -     lisinopril (PRINIVIL,ZESTRIL) 20 MG tablet; Take 1 tablet (20 mg total) by mouth daily.  Allergic rhinitis with postnasal drip -     montelukast (SINGULAIR) 10 MG tablet; Take 1 tablet (10 mg total) by mouth at bedtime. -     fluticasone (FLONASE) 50 MCG/ACT nasal spray; Place 1 spray into both nostrils daily.  Snoring -     Home sleep test; Future  At risk for obstructive sleep apnea -     Home sleep test; Future  Acute bilateral low back pain without sciatica -     tiZANidine (ZANAFLEX) 4 MG tablet; Take 1 tablet (4 mg total) by mouth daily as needed.    Patient education and anticipatory guidance given Patient agrees with treatment plan Follow-up in 6 months for medicaiton management or sooner as needed if symptoms worsen or fail to improve  Levonne Hubertharley E. Darshay Deupree PA-C

## 2018-04-13 ENCOUNTER — Encounter: Payer: Self-pay | Admitting: Physician Assistant

## 2018-04-16 MED FILL — DEXTROAMP-AMPHETAMIN 20 MG: 20 | 30 days supply | Qty: 60 | Fill #0

## 2018-05-03 ENCOUNTER — Ambulatory Visit (HOSPITAL_BASED_OUTPATIENT_CLINIC_OR_DEPARTMENT_OTHER): Payer: 59 | Attending: Physician Assistant | Admitting: Internal Medicine

## 2018-05-03 DIAGNOSIS — R0683 Snoring: Secondary | ICD-10-CM | POA: Insufficient documentation

## 2018-05-03 DIAGNOSIS — Z9189 Other specified personal risk factors, not elsewhere classified: Secondary | ICD-10-CM | POA: Diagnosis not present

## 2018-05-03 DIAGNOSIS — G4733 Obstructive sleep apnea (adult) (pediatric): Secondary | ICD-10-CM | POA: Insufficient documentation

## 2018-05-17 ENCOUNTER — Other Ambulatory Visit: Payer: Self-pay | Admitting: Physician Assistant

## 2018-05-17 DIAGNOSIS — F909 Attention-deficit hyperactivity disorder, unspecified type: Secondary | ICD-10-CM

## 2018-05-17 MED ORDER — AMPHETAMINE-DEXTROAMPHETAMINE 20 MG PO TABS: 20.0000 mg | ORAL_TABLET | Freq: Two times a day (BID) | ORAL | 0 refills | Status: DC

## 2018-05-18 DIAGNOSIS — G4733 Obstructive sleep apnea (adult) (pediatric): Secondary | ICD-10-CM | POA: Diagnosis not present

## 2018-05-18 NOTE — Procedures (Signed)
   Patient Name: Stephen Steele, Stephen Steele Date: 05/03/2018 Gender: Male D.O.B: March 07, 1991 Age (years): 27 Referring Provider: Carlis Stable PA-C Height (inches): 69 Interpreting Physician: Jetty Duhamel MD, ABSM Weight (lbs): 200 RPSGT: [place] BMI: 30 MRN: [id number] Neck Size: <br>  CLINICAL INFORMATION Sleep Study Type: HST Indication for sleep study: Snoring (786.09)  Epworth Sleepiness Score: 2  SLEEP STUDY TECHNIQUE A multi-channel overnight portable sleep study was performed. The channels recorded were: nasal airflow, thoracic respiratory movement, and oxygen saturation with a pulse oximetry. Snoring was also monitored.  MEDICATIONS Patient self administered medications include: none reported.  SLEEP ARCHITECTURE Patient was studied for 433.5 minutes. The sleep efficiency was 98.2 % and the patient was supine for 99.4%. The arousal index was 0.0 per hour.  RESPIRATORY PARAMETERS The overall AHI was 5.4 per hour, with a central apnea index of 0.0 per hour.  The oxygen nadir was 91% during sleep.  CARDIAC DATA Mean heart rate during sleep was 72.3 bpm.  IMPRESSIONS - Mild obstructive sleep apnea occurred during this study (AHI = 5.4/h). - No significant central sleep apnea occurred during this study (CAI = 0.0/h). - The patient had minimal or no oxygen desaturation during the study (Min O2 = 91%) - Patient snored.  DIAGNOSIS - Obstructive Sleep Apnea (327.23 [G47.33 ICD-10])  RECOMMENDATIONS - Treatment for minimal OSA is directed at symptoms with consideration of co-morbidities. Conservative measures may include observation, weight loss, sleep position off back. Other options including CPAP, a fitted oral appliance, or ENT evaluation, would be based on clinical judgment. - Be careful with alcohol, sedatives and other CNS depressants that may worsen sleep apnea and disrupt normal sleep architecture. - Sleep hygiene should be reviewed to assess  factors that may improve sleep quality. - Weight management and regular exercise should be initiated or continued.  [Electronically signed] 05/18/2018 09:50 AM  Jetty Duhamel MD, ABSM Diplomate, American Board of Sleep Medicine   NPI: [id number]                          Jetty Duhamel Diplomate, American Board of Sleep Medicine  ELECTRONICALLY SIGNED ON:  05/18/2018, 9:46 AM [facility] PH: [phone]   FX: [phone] ACCREDITED BY THE AMERICAN ACADEMY OF SLEEP MEDICINE

## 2018-05-20 MED ORDER — AMBULATORY NON FORMULARY MEDICATION: 0 refills | Status: DC

## 2018-05-20 MED FILL — tiZANidine HCL 4 MG TABS: 4 | 30 days supply | Qty: 30 | Fill #1

## 2018-05-20 NOTE — Progress Notes (Signed)
Hi Stephen Steele,  Your sleep study shows mild obstructive sleep apnea. Since you are being treated for high blood pressure, I would recommend that we start you on CPAP therapy.  I will write a prescription and someone from [facility] will contact you about your supplies.  Additional recommendations for managing sleep apnea include: - avoid sleeping on your back - avoid alcohol and sedatives - regular aerobic exercise and maintaining a healthy body weight

## 2018-05-20 NOTE — Addendum Note (Signed)
Addended by: Gena Fray E on: 05/20/2018 05:00 PM   Modules accepted: Orders

## 2018-05-26 MED FILL — FLUTICASONE PROP 50 MCG SPR: 50 | 60 days supply | Qty: 16 | Fill #1

## 2018-05-30 ENCOUNTER — Encounter: Payer: Self-pay | Admitting: Physician Assistant

## 2018-06-06 DIAGNOSIS — G4733 Obstructive sleep apnea (adult) (pediatric): Secondary | ICD-10-CM | POA: Diagnosis not present

## 2018-06-17 ENCOUNTER — Other Ambulatory Visit: Payer: Self-pay | Admitting: Physician Assistant

## 2018-06-17 DIAGNOSIS — F909 Attention-deficit hyperactivity disorder, unspecified type: Secondary | ICD-10-CM

## 2018-06-17 MED ORDER — AMPHETAMINE-DEXTROAMPHETAMINE 20 MG PO TABS: 20.0000 mg | ORAL_TABLET | Freq: Two times a day (BID) | ORAL | 0 refills | Status: DC

## 2018-06-17 MED FILL — DEXTROAMP-AMPHETAMIN 20 MG: 20 | 30 days supply | Qty: 60 | Fill #0

## 2018-06-19 MED FILL — tiZANidine HCL 4 MG TABS: 4 | 30 days supply | Qty: 30 | Fill #2

## 2018-07-07 DIAGNOSIS — G4733 Obstructive sleep apnea (adult) (pediatric): Secondary | ICD-10-CM | POA: Diagnosis not present

## 2018-07-08 MED FILL — MONTELUKAST SOD 10 MG TAB: 10 | 90 days supply | Qty: 90 | Fill #1

## 2018-07-12 MED FILL — ATORVASTATIN CALCIUM 20 MG: 20 | 90 days supply | Qty: 90 | Fill #1

## 2018-07-18 MED FILL — LISINOPRIL 20 MG TABLET: 20 | 90 days supply | Qty: 90 | Fill #1

## 2018-07-25 ENCOUNTER — Encounter: Payer: Self-pay | Admitting: Physician Assistant

## 2018-07-25 ENCOUNTER — Other Ambulatory Visit: Payer: Self-pay

## 2018-07-25 ENCOUNTER — Ambulatory Visit (INDEPENDENT_AMBULATORY_CARE_PROVIDER_SITE_OTHER): Payer: 59 | Admitting: Physician Assistant

## 2018-07-25 VITALS — BP 123/73 | HR 88 | Wt 207.0 lb

## 2018-07-25 DIAGNOSIS — F909 Attention-deficit hyperactivity disorder, unspecified type: Secondary | ICD-10-CM

## 2018-07-25 DIAGNOSIS — G4733 Obstructive sleep apnea (adult) (pediatric): Secondary | ICD-10-CM | POA: Diagnosis not present

## 2018-07-25 DIAGNOSIS — I10 Essential (primary) hypertension: Secondary | ICD-10-CM

## 2018-07-25 MED ORDER — AMPHETAMINE-DEXTROAMPHETAMINE 20 MG PO TABS: 20.0000 mg | ORAL_TABLET | Freq: Two times a day (BID) | ORAL | 0 refills | Status: DC

## 2018-07-25 MED ORDER — AMBULATORY NON FORMULARY MEDICATION: 0 refills | Status: DC

## 2018-07-25 MED FILL — DEXTROAMP-AMPHETAMIN 20 MG: 20 | 30 days supply | Qty: 60 | Fill #0

## 2018-07-25 NOTE — Patient Instructions (Signed)

## 2018-07-25 NOTE — Progress Notes (Signed)
HPI:                                                                Stephen Steele is a 27 y.o. male who presents to [facility]: Primary Care Sports Medicine today for medication mgmt  Mild OSA: Uses CPAP nightly, most nights.Does not wear it some weekends when he travels. Wears it for 6 hours on average.  Feels like he is at times snoring through the mask. He also wakes up sometimes and has to turn his settings down because he feels like he's being blown away. Has not noticed a major difference since starting therapy, but states he has noticed on days when he doesn't use it he feels exceptionally tired  HTN: takingLisinopril 20 mgdaily, increased from 10 mg 3 months ago.Compliant with medications.Does not check BP's at home. Has reduced caffeine to 1 energy drink per day and has reduced vaping to 1-2 times per day. Denies vision change, headache, chest painwith exertion, orthopnea, lightheadedness,syncopeand edema. Risk factors include:male sex, HLD  ADHD: doing well on Adderall 20 mg twice a day. Denies headache, insomnia, palpitations, chest pain.  Past Medical History:  Diagnosis Date  . Acute bilateral low back pain without sciatica 07/24/2017  . Adult ADHD 04/13/2017  . Allergy   . Chronic left shoulder pain   . Hypertension 04/13/2017  . Mixed hyperlipidemia 04/16/2017  . Obesity   . Pleural effusion on right 10/25/2017  . Tachycardia with heart rate 100-120 beats per minute 01/02/2018   Past Surgical History:  Procedure Laterality Date  . HAND SURGERY Right   . TONSILLECTOMY  1997   Social History   Tobacco Use  . Smoking status: Former Smoker    Years: 2.00    Types: Cigarettes    Last attempt to quit: 04/13/2012    Years since quitting: 6.2  . Smokeless tobacco: Never Used  Substance Use Topics  . Alcohol use: Yes    Alcohol/week: 7.0 standard drinks    Types: 7 Glasses of wine per week   family history includes Cancer in his paternal  grandfather; Depression in his mother and sister; Heart attack in his paternal grandfather; Hypercholesterolemia in his father; Stroke in his paternal grandfather.    ROS: negative except as noted in the HPI  Medications: Current Outpatient Medications  Medication Sig Dispense Refill  . AMBULATORY NON FORMULARY MEDICATION 1 week download 1 each 0  . atorvastatin (LIPITOR) 20 MG tablet Take 1 tablet (20 mg total) by mouth at bedtime. 90 tablet 1  . cetirizine (ZYRTEC) 10 MG tablet Take 10 mg by mouth daily.    . fluticasone (FLONASE) 50 MCG/ACT nasal spray Place 1 spray into both nostrils daily. 16 g 3  . lisinopril (PRINIVIL,ZESTRIL) 20 MG tablet Take 1 tablet (20 mg total) by mouth daily. 90 tablet 1  . montelukast (SINGULAIR) 10 MG tablet Take 1 tablet (10 mg total) by mouth at bedtime. 90 tablet 3  . tiZANidine (ZANAFLEX) 4 MG tablet Take 1 tablet (4 mg total) by mouth daily as needed. 30 tablet 3  . amphetamine-dextroamphetamine (ADDERALL) 20 MG tablet Take 1 tablet (20 mg total) by mouth 2 (two) times daily. 60 tablet 0   No current facility-administered medications for this visit.  No Known Allergies     Objective:  BP 123/73   Pulse 88   Wt 207 lb (93.9 kg)   BMI 32.42 kg/m  Gen:  alert, not ill-appearing, no distress, appropriate for age HEENT: head normocephalic without obvious abnormality, conjunctiva and cornea clear, trachea midline Pulm: Normal work of breathing, normal phonation, clear to auscultation bilaterally, no wheezes, rales or rhonchi CV: Normal rate, regular rhythm, s1 and s2 distinct, no murmurs, clicks or rubs  Neuro: alert and oriented x 3, no tremor MSK: extremities atraumatic, normal gait and station Skin: intact, no rashes on exposed skin, no jaundice, no cyanosis Psych: well-groomed, cooperative, good eye contact, euthymic mood, affect mood-congruent, speech is articulate, and thought processes clear and goal-directed  Lab Results  Component  Value Date   CREATININE 1.04 01/02/2018   BUN 15 01/02/2018   NA 138 01/02/2018   K 4.1 01/02/2018   CL 101 01/02/2018   CO2 26 01/02/2018   Lab Results  Component Value Date   CHOL 232 (H) 01/02/2018   HDL 34 (L) 01/02/2018   LDLCALC 172 (H) 01/02/2018   TRIG 129 01/02/2018   CHOLHDL 6.8 (H) 01/02/2018   The ASCVD Risk score (Goff DC Jr., et al., 2013) failed to calculate for the following reasons:   The 2013 ASCVD risk score is only valid for ages 3340 to 8479    No results found for this or any previous visit (from the past 72 hour(s)). No results found.    Assessment and Plan: 27 y.o. male with   .Stephen Steele was seen today for medication management.  Diagnoses and all orders for this visit:  Mild obstructive sleep apnea -     AMBULATORY NON FORMULARY MEDICATION; 1 week download  Hypertension goal BP (blood pressure) < 130/80  Adult ADHD -     Discontinue: amphetamine-dextroamphetamine (ADDERALL) 20 MG tablet; Take 1 tablet (20 mg total) by mouth 2 (two) times daily.   Mild OSA - compliant with CPAP therapy - awaiting 1 week download from [facility] - currently on Auto-pap, will consider adjusting to continuous pressure based on AHI - also discussed adding chin strap, since he currently uses a nasal pillow mask and feels like he is still mouth breathing/snoring  HTN - BP well controlled - Cont Lisinopril 20 mg  Adult ADHD - BP well-controlled, no additional tachycardia since OV on 01/02/18 - cont Adderall 20 mg bid - follow-up in office every 6 months for refills  Patient education and anticipatory guidance given Patient agrees with treatment plan Follow-up in 6 months or sooner as needed if symptoms worsen or fail to improve  Stephen Hubertharley E. Cummings PA-C

## 2018-07-29 ENCOUNTER — Encounter: Payer: Self-pay | Admitting: Physician Assistant

## 2018-07-29 DIAGNOSIS — G4733 Obstructive sleep apnea (adult) (pediatric): Secondary | ICD-10-CM | POA: Insufficient documentation

## 2018-07-29 DIAGNOSIS — R918 Other nonspecific abnormal finding of lung field: Secondary | ICD-10-CM | POA: Insufficient documentation

## 2018-08-01 MED FILL — tiZANidine HCL 4 MG TABS: 4 | 30 days supply | Qty: 30 | Fill #3

## 2018-08-06 DIAGNOSIS — G4733 Obstructive sleep apnea (adult) (pediatric): Secondary | ICD-10-CM | POA: Diagnosis not present

## 2018-08-22 ENCOUNTER — Other Ambulatory Visit: Payer: Self-pay | Admitting: Physician Assistant

## 2018-08-22 DIAGNOSIS — F909 Attention-deficit hyperactivity disorder, unspecified type: Secondary | ICD-10-CM

## 2018-08-22 MED ORDER — AMPHETAMINE-DEXTROAMPHETAMINE 20 MG PO TABS: 20.0000 mg | ORAL_TABLET | Freq: Two times a day (BID) | ORAL | 0 refills | Status: DC

## 2018-08-22 MED FILL — DEXTROAMP-AMPHETAMIN 20 MG: 20 | 30 days supply | Qty: 60 | Fill #0

## 2018-08-26 ENCOUNTER — Telehealth: Payer: 59 | Admitting: Family

## 2018-08-26 DIAGNOSIS — R509 Fever, unspecified: Secondary | ICD-10-CM

## 2018-08-26 DIAGNOSIS — M436 Torticollis: Secondary | ICD-10-CM

## 2018-08-26 DIAGNOSIS — R6889 Other general symptoms and signs: Secondary | ICD-10-CM

## 2018-08-26 DIAGNOSIS — R519 Headache, unspecified: Secondary | ICD-10-CM

## 2018-08-26 DIAGNOSIS — R51 Headache: Secondary | ICD-10-CM

## 2018-08-26 NOTE — Progress Notes (Signed)
Based on what you shared with me it looks like you have a serious condition that should be evaluated in a face to face office visit.  NOTE: If you entered your credit card information for this eVisit, you will not be charged. You may see a "hold" on your card for the $30 but that hold will drop off and you will not have a charge processed.  Since you are having multiple symptoms that include a stiff neck, headache, and fever I recommend that you follow up to be seen face-to-face.  If you are having a true medical emergency please call 911.  If you need an urgent face to face visit, [place] has four urgent care centers for your convenience.  If you need care fast and have a high deductible or no insurance consider:   [email or web address] to reserve your spot online an avoid wait times  [facility] [address] [place], [place] [address] 8 am to 8 pm Monday-Friday 10 am to 4 pm Saturday-Sunday *Across the street from [employer]  [address] [place], [address] 8 am to 5 pm Monday-Friday * In the [facility] on the [facility]   The following sites will take your  insurance:  . [facility]  [phone] Get Driving Directions Find a Provider at this Location  [address] [place], [place] [address] . 10 am to 8 pm Monday-Friday . 12 pm to 8 pm Saturday-Sunday   . [facility] at [facility]  [phone] Get Driving Directions Find a Provider at this Location  [address], [address] [place], [place] [address] . 8 am to 8 pm Monday-Friday . 9 am to 6 pm Saturday . 11 am to 6 pm Sunday   . [facility] at [facility]  [phone] Get Driving Directions  [address] [place], [place] [address] . 8 am to 8 pm Monday-Friday . 8 am to 4 pm Saturday-Sunday   Your e-visit answers were reviewed by a board certified advanced  clinical practitioner to complete your personal care plan.  Thank you for using e-Visits.

## 2018-08-28 MED FILL — FLUTICASONE PROP 50 MCG SPR: 50 | 60 days supply | Qty: 16 | Fill #2

## 2018-09-06 DIAGNOSIS — G4733 Obstructive sleep apnea (adult) (pediatric): Secondary | ICD-10-CM | POA: Diagnosis not present

## 2018-09-12 DIAGNOSIS — G4733 Obstructive sleep apnea (adult) (pediatric): Secondary | ICD-10-CM | POA: Diagnosis not present

## 2018-09-19 ENCOUNTER — Other Ambulatory Visit: Payer: Self-pay | Admitting: Physician Assistant

## 2018-09-19 DIAGNOSIS — M545 Low back pain, unspecified: Secondary | ICD-10-CM

## 2018-09-19 MED FILL — tiZANidine HCL 4 MG TABS: 4 | 30 days supply | Qty: 30 | Fill #0

## 2018-09-24 ENCOUNTER — Other Ambulatory Visit: Payer: Self-pay | Admitting: Physician Assistant

## 2018-09-24 DIAGNOSIS — F909 Attention-deficit hyperactivity disorder, unspecified type: Secondary | ICD-10-CM

## 2018-09-25 MED ORDER — AMPHETAMINE-DEXTROAMPHETAMINE 20 MG PO TABS: 20.0000 mg | ORAL_TABLET | Freq: Two times a day (BID) | ORAL | 0 refills | Status: DC

## 2018-09-25 MED FILL — DEXTROAMP-AMPHETAMIN 20 MG: 20 | 30 days supply | Qty: 60 | Fill #0

## 2018-09-30 MED FILL — tiZANidine HCL 4 MG TABS: 4 | 30 days supply | Qty: 30 | Fill #0

## 2018-10-09 MED FILL — MONTELUKAST SOD 10 MG TAB: 10 | 90 days supply | Qty: 90 | Fill #2

## 2018-10-09 MED FILL — ATORVASTATIN 20 MG TABLET: 20 | 90 days supply | Qty: 90 | Fill #1

## 2018-10-15 MED FILL — LISINOPRIL 20 MG TABLET: 20 | 90 days supply | Qty: 90 | Fill #1

## 2018-10-22 ENCOUNTER — Other Ambulatory Visit: Payer: Self-pay | Admitting: Physician Assistant

## 2018-10-22 DIAGNOSIS — F909 Attention-deficit hyperactivity disorder, unspecified type: Secondary | ICD-10-CM

## 2018-10-23 MED ORDER — AMPHETAMINE-DEXTROAMPHETAMINE 20 MG PO TABS: 20.0000 mg | ORAL_TABLET | Freq: Two times a day (BID) | ORAL | 0 refills | Status: DC

## 2018-10-23 MED FILL — AMPHETAMINE-DEXTROAMPHETAMI: 20 | 30 days supply | Qty: 60 | Fill #0

## 2018-10-28 MED FILL — FLUTICASONE PROP 50 MCG SPR: 50 | 60 days supply | Qty: 16 | Fill #3

## 2018-10-28 MED FILL — tiZANidine HCL 4 MG TABS: 4 | 30 days supply | Qty: 30 | Fill #1

## 2018-11-20 DIAGNOSIS — G4733 Obstructive sleep apnea (adult) (pediatric): Secondary | ICD-10-CM | POA: Diagnosis not present

## 2018-11-27 ENCOUNTER — Other Ambulatory Visit: Payer: Self-pay | Admitting: Physician Assistant

## 2018-11-27 DIAGNOSIS — F909 Attention-deficit hyperactivity disorder, unspecified type: Secondary | ICD-10-CM

## 2018-11-27 MED ORDER — AMPHETAMINE-DEXTROAMPHETAMINE 20 MG PO TABS: 20.0000 mg | ORAL_TABLET | Freq: Two times a day (BID) | ORAL | 0 refills | Status: DC

## 2018-11-27 MED FILL — AMPHETAMINE-DEXTROAMPHETAMI: 20 | 30 days supply | Qty: 60 | Fill #0

## 2018-11-27 NOTE — Telephone Encounter (Signed)
Follow up due in May.

## 2018-12-02 ENCOUNTER — Other Ambulatory Visit: Payer: Self-pay | Admitting: Physician Assistant

## 2018-12-02 DIAGNOSIS — M545 Low back pain, unspecified: Secondary | ICD-10-CM

## 2018-12-02 MED FILL — tiZANidine HCL 4 MG TABS: 4 | 30 days supply | Qty: 30 | Fill #0

## 2018-12-30 ENCOUNTER — Other Ambulatory Visit: Payer: Self-pay | Admitting: Physician Assistant

## 2018-12-30 DIAGNOSIS — M545 Low back pain, unspecified: Secondary | ICD-10-CM

## 2018-12-30 DIAGNOSIS — F909 Attention-deficit hyperactivity disorder, unspecified type: Secondary | ICD-10-CM

## 2019-01-02 ENCOUNTER — Other Ambulatory Visit: Payer: Self-pay | Admitting: Physician Assistant

## 2019-01-02 ENCOUNTER — Other Ambulatory Visit: Payer: Self-pay

## 2019-01-02 DIAGNOSIS — F909 Attention-deficit hyperactivity disorder, unspecified type: Secondary | ICD-10-CM

## 2019-01-02 DIAGNOSIS — M545 Low back pain, unspecified: Secondary | ICD-10-CM

## 2019-01-02 IMAGING — CT CT CHEST W/ CM
2 of 4 series · 15 of 36 positions shown, 18 images · IV contrast (iopamidol)
Comparison: Chest radiograph, 10/23/2017.

CLINICAL DATA: Recent cough and chest tightness after having the
flu. Symptoms have improved over the last week.

EXAM:
CT CHEST WITH CONTRAST
TECHNIQUE: Multidetector CT imaging of the chest was performed during
intravenous contrast administration.
CONTRAST:  80mL [id number] IOPAMIDOL ([id number]) INJECTION 61%

[Series 3: lungs · axial · 0.69mm/px · z∈[-350,-60]mm · 12 of 163 slices shown, 15 images]
[im 9/163  mediastinal]
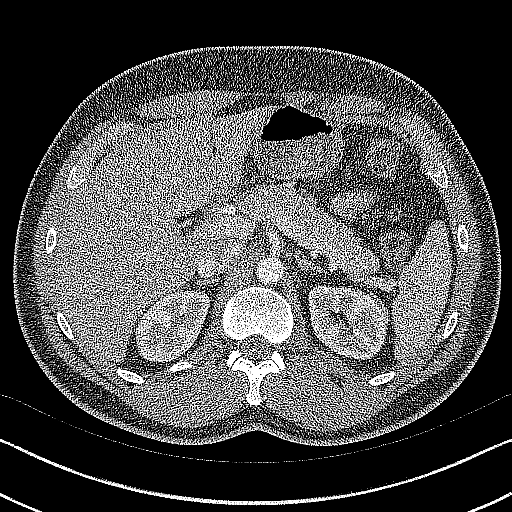
[im 9/163  lung]
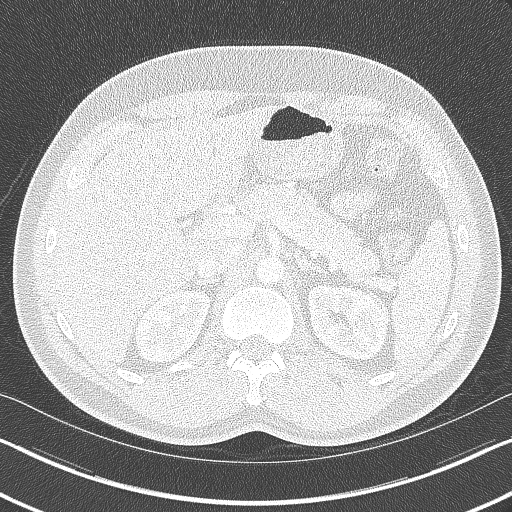
[im 26/163  lung]
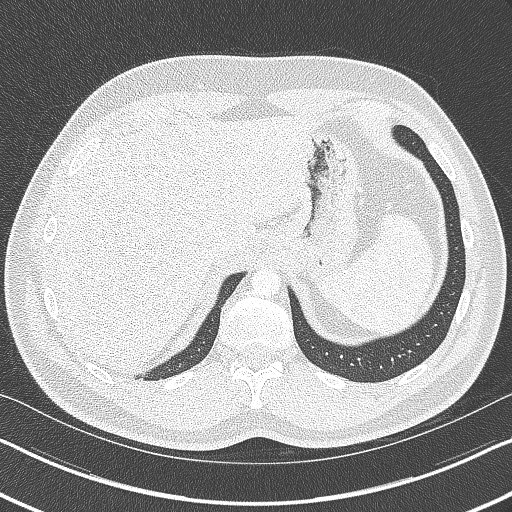
[im 35/163  lung]
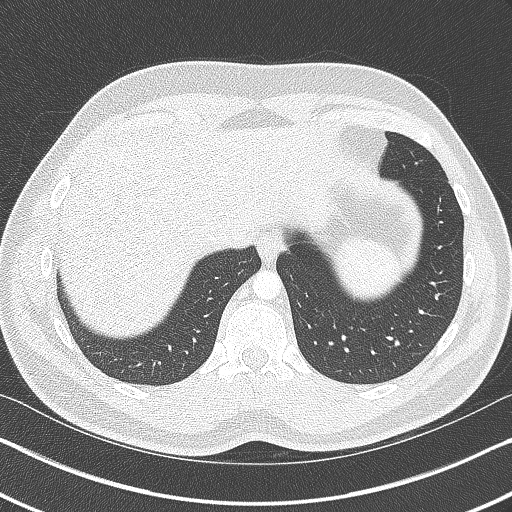
[im 52/163  lung]
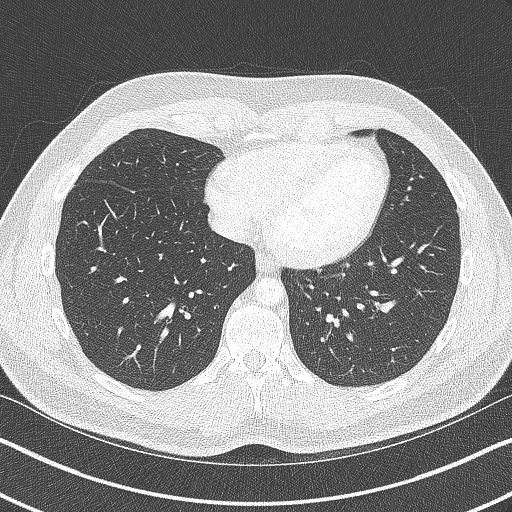
[im 60/163  mediastinal]
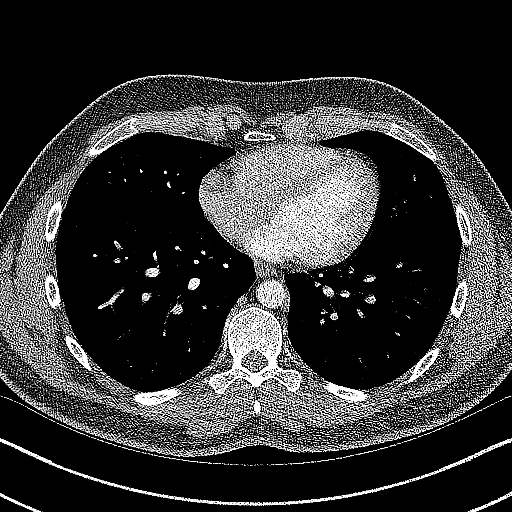
[im 60/163  lung]
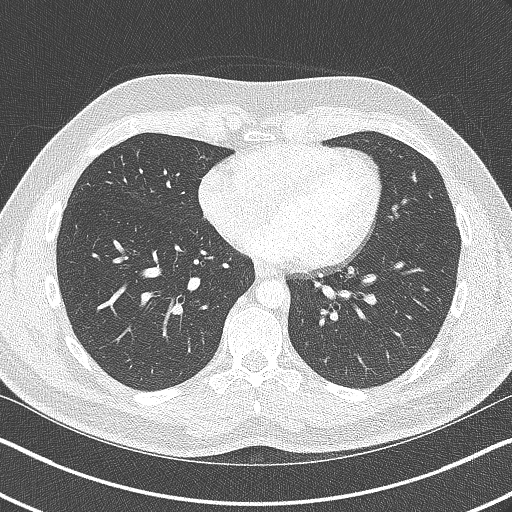
[im 77/163  lung]
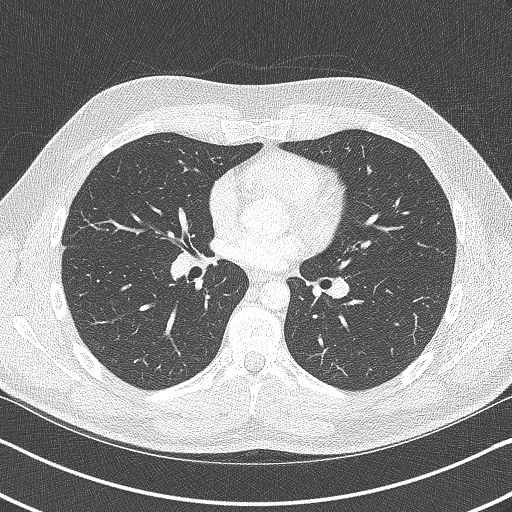
[im 86/163  lung]
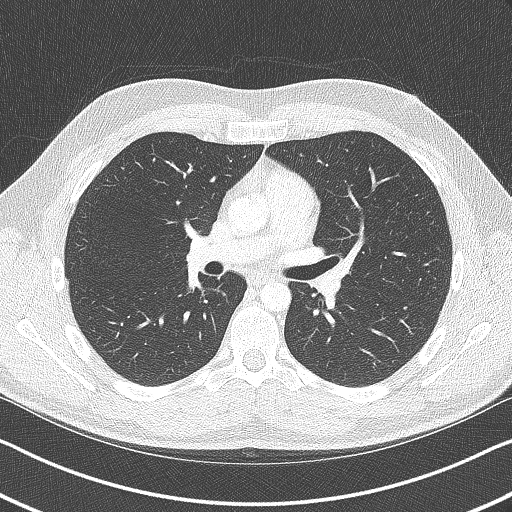
[im 103/163  lung]
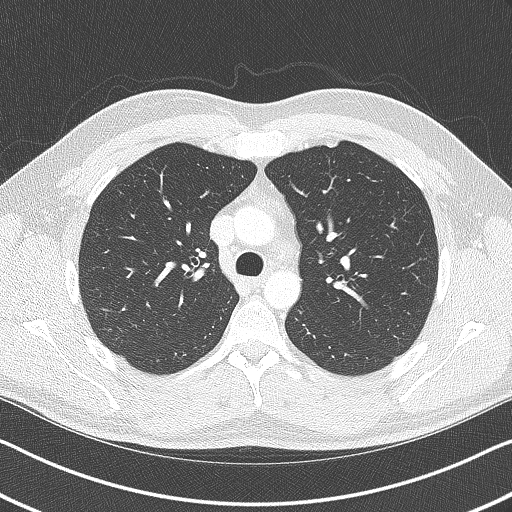
[im 111/163  mediastinal]
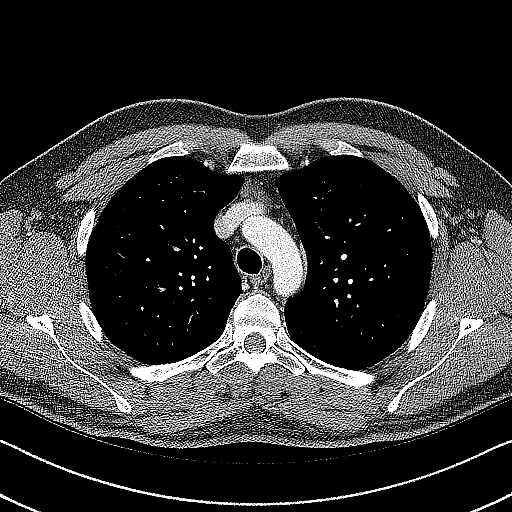
[im 111/163  lung]
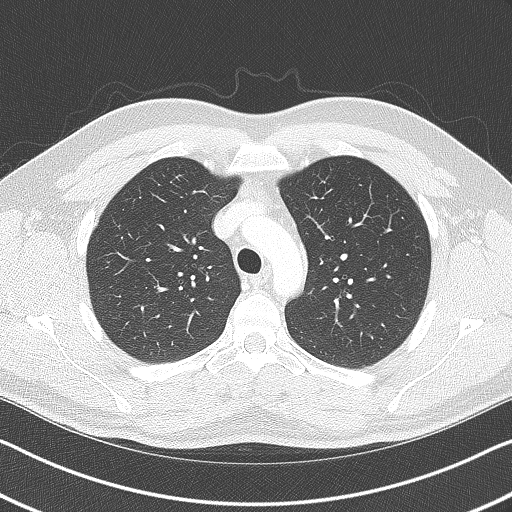
[im 128/163  lung]
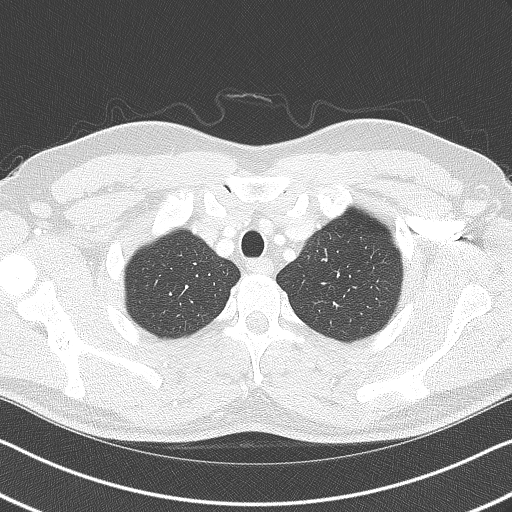
[im 137/163  lung]
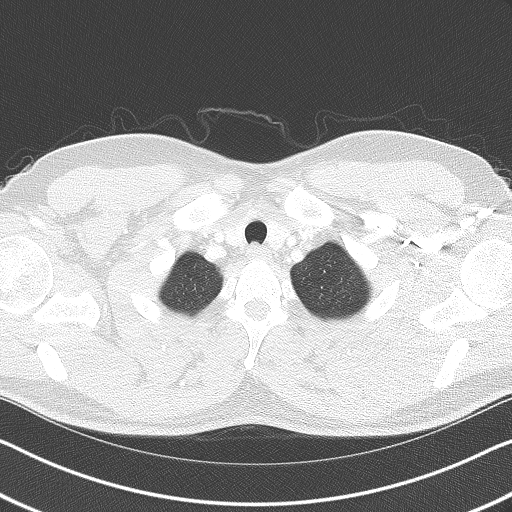
[im 154/163  lung]
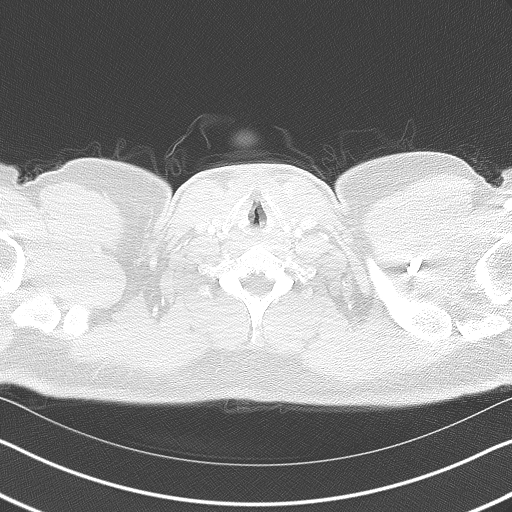

[Series 5: coronal · coronal · 0.63mm/px · 3 of 126 slices shown]
[im 26/126  lung]
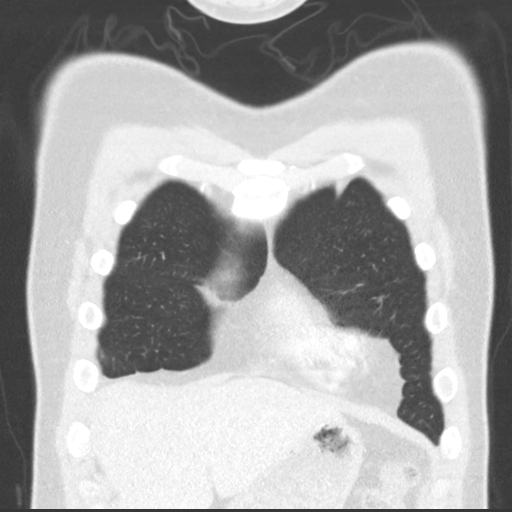
[im 51/126  lung]
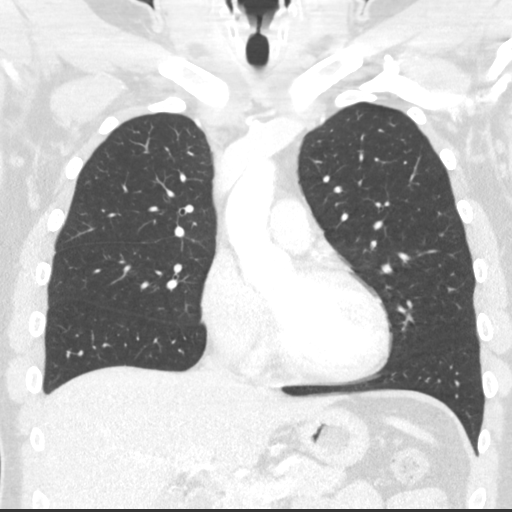
[im 76/126  lung]
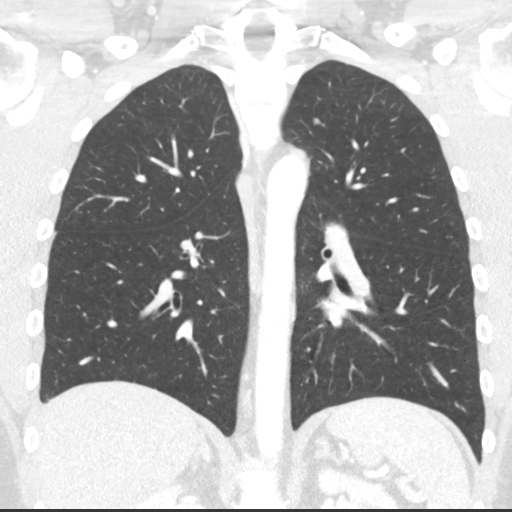

[15 of 36 positions shown; findings below may reference images not displayed]

FINDINGS: Cardiovascular: Heart is normal in size and configuration. No
pericardial effusion. Great vessels are within normal limits.

Mediastinum/Nodes: No enlarged mediastinal, hilar, or axillary lymph
nodes. Thyroid gland, trachea, and esophagus demonstrate no
significant findings.

Lungs/Pleura: 3 mm left upper lobe nodule, image 56, series 3. 3 mm
pleural-based nodule adjacent to the minor fissure, image 80. Lungs
otherwise clear. No pleural effusion or pneumothorax.

Upper Abdomen: Unremarkable.

Musculoskeletal: No chest wall abnormality. No acute or significant
osseous findings.
IMPRESSION: 1. No acute findings.  No evidence of pneumonia.
2. Two 3 mm lung nodules, which can be considered benign in this age
group needing no follow-up evaluation. No other abnormalities.

## 2019-01-02 MED ORDER — AMPHETAMINE-DEXTROAMPHETAMINE 20 MG PO TABS: 20.0000 mg | ORAL_TABLET | Freq: Two times a day (BID) | ORAL | 0 refills | Status: DC

## 2019-01-02 MED FILL — AMPHETAMINE-DEXTROAMPHETAMI: 20 | 30 days supply | Qty: 60 | Fill #0

## 2019-01-02 NOTE — Progress Notes (Unsigned)
Refill sent Please schedule follow-up appt for June

## 2019-01-03 ENCOUNTER — Encounter: Payer: Self-pay | Admitting: Physician Assistant

## 2019-01-03 DIAGNOSIS — M545 Low back pain, unspecified: Secondary | ICD-10-CM

## 2019-01-03 MED ORDER — TIZANIDINE HCL 4 MG PO TABS: ORAL_TABLET | ORAL | 0 refills | Status: DC

## 2019-01-03 MED FILL — tiZANidine HCL 4 MG TABS: 4 | 30 days supply | Qty: 30 | Fill #0

## 2019-01-07 ENCOUNTER — Other Ambulatory Visit: Payer: Self-pay | Admitting: Physician Assistant

## 2019-01-07 DIAGNOSIS — I10 Essential (primary) hypertension: Secondary | ICD-10-CM

## 2019-01-07 DIAGNOSIS — E782 Mixed hyperlipidemia: Secondary | ICD-10-CM

## 2019-01-07 MED FILL — MONTELUKAST SOD 10 MG TAB: 10 | 90 days supply | Qty: 90 | Fill #3

## 2019-01-07 MED FILL — LISINOPRIL 20 MG TABLET: 20 | 30 days supply | Qty: 30 | Fill #0

## 2019-01-07 MED FILL — ATORVASTATIN 20 MG TABLET: 20 | 30 days supply | Qty: 30 | Fill #0

## 2019-02-04 ENCOUNTER — Ambulatory Visit (INDEPENDENT_AMBULATORY_CARE_PROVIDER_SITE_OTHER): Payer: 59 | Admitting: Physician Assistant

## 2019-02-04 ENCOUNTER — Encounter: Payer: Self-pay | Admitting: Physician Assistant

## 2019-02-04 VITALS — BP 127/83 | HR 89 | Temp 98.5°F | Wt 204.0 lb

## 2019-02-04 DIAGNOSIS — E782 Mixed hyperlipidemia: Secondary | ICD-10-CM | POA: Diagnosis not present

## 2019-02-04 DIAGNOSIS — M545 Low back pain, unspecified: Secondary | ICD-10-CM | POA: Insufficient documentation

## 2019-02-04 DIAGNOSIS — G4733 Obstructive sleep apnea (adult) (pediatric): Secondary | ICD-10-CM | POA: Diagnosis not present

## 2019-02-04 DIAGNOSIS — I10 Essential (primary) hypertension: Secondary | ICD-10-CM | POA: Diagnosis not present

## 2019-02-04 DIAGNOSIS — G8929 Other chronic pain: Secondary | ICD-10-CM | POA: Diagnosis not present

## 2019-02-04 DIAGNOSIS — Z5181 Encounter for therapeutic drug level monitoring: Secondary | ICD-10-CM | POA: Diagnosis not present

## 2019-02-04 DIAGNOSIS — Z9989 Dependence on other enabling machines and devices: Secondary | ICD-10-CM

## 2019-02-04 DIAGNOSIS — F909 Attention-deficit hyperactivity disorder, unspecified type: Secondary | ICD-10-CM | POA: Diagnosis not present

## 2019-02-04 DIAGNOSIS — G4710 Hypersomnia, unspecified: Secondary | ICD-10-CM | POA: Diagnosis not present

## 2019-02-04 MED ORDER — TIZANIDINE HCL 4 MG PO TABS: 4.0000 mg | ORAL_TABLET | Freq: Every evening | ORAL | 0 refills | Status: DC | PRN

## 2019-02-04 MED ORDER — LISINOPRIL 20 MG PO TABS: 20.0000 mg | ORAL_TABLET | Freq: Every day | ORAL | 0 refills | Status: DC

## 2019-02-04 MED ORDER — AMPHETAMINE-DEXTROAMPHETAMINE 20 MG PO TABS: 20.0000 mg | ORAL_TABLET | Freq: Two times a day (BID) | ORAL | 0 refills | Status: DC

## 2019-02-04 MED ORDER — ATORVASTATIN CALCIUM 20 MG PO TABS: 20.0000 mg | ORAL_TABLET | Freq: Every day | ORAL | 0 refills | Status: DC

## 2019-02-04 MED FILL — ATORVASTATIN 20 MG TABLET: 20 | 90 days supply | Qty: 90 | Fill #0

## 2019-02-04 MED FILL — tiZANidine HCL 4 MG TABS: 4 | 90 days supply | Qty: 90 | Fill #0

## 2019-02-04 MED FILL — AMPHETAMINE-DEXTROAMPHETAMI: 20 | 30 days supply | Qty: 60 | Fill #0

## 2019-02-04 MED FILL — LISINOPRIL 20 MG TABLET: 20 | 90 days supply | Qty: 90 | Fill #0

## 2019-02-04 NOTE — Patient Instructions (Signed)

## 2019-02-04 NOTE — Progress Notes (Signed)
HPI:                                                                Stephen Steele is a 28 y.o. male who presents to [employer] [facility]: [facility] today for medication mgmt  Mild OSA: Uses CPAP nightly, most nights. Wears it for 5 hours on average. Occasionally wakes up and mask has been removed/machine turned off. Still endorses hypersomnolence, not interfering with job or daily activity.  HTN: takingLisinopril 20 mgdaily.Compliant with medications.Occasionally checks BP at home and reports BP is in range (no readings today). Has reduced caffeine to 1 energy drink per day and has reduced vaping to 1-2 times per day. Denies vision change, headache, chest painwith exertion, orthopnea, lightheadedness,syncopeand edema. Risk factors include:male sex, HLD  ADHD: doing well on Adderall 20 mg twice a day. Denies mood changes, sleep disturbance. Denies headache,  palpitations/irregular heart beat, chest pain.  Chronic LBP: onset >5 years ago. Pain is mild, persistent. Takes Zanaflex most nights for spasm and Tylenol 1000 mg prn most days for pain. Denies constitutional symptoms, saddle numbness, bowel/bladder dysfunction or radicular symptoms.  Past Medical History:  Diagnosis Date  . Acute bilateral low back pain without sciatica 07/24/2017  . Adult ADHD 04/13/2017  . Allergy   . Chronic left shoulder pain   . Hypertension 04/13/2017  . Mixed hyperlipidemia 04/16/2017  . Obesity   . Pleural effusion on right 10/25/2017  . Tachycardia with heart rate 100-120 beats per minute 01/02/2018   Past Surgical History:  Procedure Laterality Date  . HAND SURGERY Right   . TONSILLECTOMY  1997   Social History   Tobacco Use  . Smoking status: Former Smoker    Years: 2.00    Types: Cigarettes    Quit date: 04/13/2012    Years since quitting: 6.8  . Smokeless tobacco: Never Used  Substance Use Topics  . Alcohol use: Yes    Alcohol/week: 7.0 standard drinks   Types: 7 Glasses of wine per week   family history includes Cancer in his paternal grandfather; Depression in his mother and sister; Heart attack in his paternal grandfather; Hypercholesterolemia in his father; Stroke in his paternal grandfather.    ROS: negative except as noted in the HPI  Medications: Current Outpatient Medications  Medication Sig Dispense Refill  . AMBULATORY NON FORMULARY MEDICATION 1 week download 1 each 0  . amphetamine-dextroamphetamine (ADDERALL) 20 MG tablet Take 1 tablet (20 mg total) by mouth 2 (two) times daily. 60 tablet 0  . atorvastatin (LIPITOR) 20 MG tablet Take 1 tablet (20 mg total) by mouth at bedtime. 90 tablet 0  . cetirizine (ZYRTEC) 10 MG tablet Take 10 mg by mouth daily.    . fluticasone (FLONASE) 50 MCG/ACT nasal spray Place 1 spray into both nostrils daily. 16 g 3  . lisinopril (ZESTRIL) 20 MG tablet Take 1 tablet (20 mg total) by mouth daily. 90 tablet 0  . montelukast (SINGULAIR) 10 MG tablet Take 1 tablet (10 mg total) by mouth at bedtime. 90 tablet 3  . tiZANidine (ZANAFLEX) 4 MG tablet Take 1 tablet (4 mg total) by mouth at bedtime as needed for muscle spasms. TAKE 1 TABLET BY MOUTH ONCE A DAY AS NEEDED 90 tablet 0  No current facility-administered medications for this visit.    No Known Allergies     Objective:  BP 127/83   Pulse 89   Temp 98.5 F (36.9 C) (Oral)   Wt 204 lb (92.5 kg)   BMI 31.95 kg/m  Gen:  alert, not ill-appearing, no distress, appropriate for age HEENT: head normocephalic without obvious abnormality, conjunctiva and cornea clear, trachea midline Pulm: Normal work of breathing, normal phonation, clear to auscultation bilaterally, no wheezes, rales or rhonchi CV: Normal rate, regular rhythm, s1 and s2 distinct, no murmurs, clicks or rubs  Neuro: alert and oriented x 3, no tremor MSK: extremities atraumatic, normal gait and station Skin: intact, no rashes on exposed skin, no jaundice, no cyanosis Psych:  well-groomed, cooperative, good eye contact, euthymic mood, affect mood-congruent, speech is articulate, and thought processes clear and goal-directed  BP Readings from Last 3 Encounters:  02/04/19 127/83  07/25/18 123/73  04/08/18 124/78   Wt Readings from Last 3 Encounters:  02/04/19 204 lb (92.5 kg)  07/25/18 207 lb (93.9 kg)  04/08/18 198 lb (89.8 kg)    Lab Results  Component Value Date   CREATININE 1.04 01/02/2018   BUN 15 01/02/2018   NA 138 01/02/2018   K 4.1 01/02/2018   CL 101 01/02/2018   CO2 26 01/02/2018   Lab Results  Component Value Date   CHOL 232 (H) 01/02/2018   HDL 34 (L) 01/02/2018   LDLCALC 172 (H) 01/02/2018   TRIG 129 01/02/2018   CHOLHDL 6.8 (H) 01/02/2018    Assessment and Plan: 28 y.o. male with   .Diagnoses and all orders for this visit:  Medication monitoring encounter -     Lipid Panel w/reflex Direct LDL -     CBC -     COMPLETE METABOLIC PANEL WITH GFR  Adult ADHD -     amphetamine-dextroamphetamine (ADDERALL) 20 MG tablet; Take 1 tablet (20 mg total) by mouth 2 (two) times daily.  Mixed hyperlipidemia -     atorvastatin (LIPITOR) 20 MG tablet; Take 1 tablet (20 mg total) by mouth at bedtime. -     Lipid Panel w/reflex Direct LDL  Hypertension goal BP (blood pressure) < 130/80 -     lisinopril (ZESTRIL) 20 MG tablet; Take 1 tablet (20 mg total) by mouth daily. -     CBC -     COMPLETE METABOLIC PANEL WITH GFR  Hypersomnolence  OSA on CPAP  Chronic bilateral low back pain without sciatica -     tiZANidine (ZANAFLEX) 4 MG tablet; Take 1 tablet (4 mg total) by mouth at bedtime as needed for muscle spasms. TAKE 1 TABLET BY MOUTH ONCE A DAY AS NEEDED   Fasting labs pending  Mild OSA - compliant with CPAP therapy, patient estimates use 5h/nightly on average  HTN - BP well controlled - Cont Lisinopril 20 mg - due for routine lab monitoring today  Adult ADHD - BP well-controlled, no additional tachycardia since OV on  01/02/18 - cont Adderall 20 mg bid - follow-up in office every 6 months for refills  Chronic LBP - declined referral for PT - he will consider chiropractic care - limit use of NSAIDs due to HTN  Patient education and anticipatory guidance given Patient agrees with treatment plan Follow-up in 6 months or sooner as needed if symptoms worsen or fail to improve  Levonne Hubertharley E. Nhyla Nappi PA-C

## 2019-02-05 ENCOUNTER — Encounter: Payer: Self-pay | Admitting: Physician Assistant

## 2019-02-05 ENCOUNTER — Other Ambulatory Visit: Payer: Self-pay | Admitting: *Deleted

## 2019-02-05 ENCOUNTER — Other Ambulatory Visit: Payer: Self-pay | Admitting: Physician Assistant

## 2019-02-05 DIAGNOSIS — D649 Anemia, unspecified: Secondary | ICD-10-CM

## 2019-02-05 LAB — COMPLETE METABOLIC PANEL WITH GFR
AG Ratio: 2.4 (calc) (ref 1.0–2.5)
ALT: 31 U/L (ref 9–46)
AST: 18 U/L (ref 10–40)
Albumin: 4.7 g/dL (ref 3.6–5.1)
Alkaline phosphatase (APISO): 58 U/L (ref 36–130)
BUN: 14 mg/dL (ref 7–25)
CO2: 26 mmol/L (ref 20–32)
Calcium: 9.8 mg/dL (ref 8.6–10.3)
Chloride: 103 mmol/L (ref 98–110)
Creat: 1.08 mg/dL (ref 0.60–1.35)
GFR, Est African American: 108 mL/min/{1.73_m2} (ref >=60)
GFR, Est Non African American: 94 mL/min/{1.73_m2} (ref >=60)
Globulin: 2 g/dL (calc) (ref 1.9–3.7)
Glucose, Bld: 89 mg/dL (ref 65–99)
Potassium: 4.4 mmol/L (ref 3.5–5.3)
Sodium: 138 mmol/L (ref 135–146)
Total Bilirubin: 0.6 mg/dL (ref 0.2–1.2)
Total Protein: 6.7 g/dL (ref 6.1–8.1)

## 2019-02-05 LAB — CBC
HCT: 38.8 % (ref 38.5–50.0)
Hemoglobin: 13 g/dL — ABNORMAL LOW (ref 13.2–17.1)
MCH: 30.5 pg (ref 27.0–33.0)
MCHC: 33.5 g/dL (ref 32.0–36.0)
MCV: 91.1 fL (ref 80.0–100.0)
MPV: 9.7 fL (ref 7.5–12.5)
Platelets: 281 10*3/uL (ref 140–400)
RBC: 4.26 10*6/uL (ref 4.20–5.80)
RDW: 12.9 % (ref 11.0–15.0)
WBC: 5.8 10*3/uL (ref 3.8–10.8)

## 2019-02-05 LAB — LIPID PANEL W/REFLEX DIRECT LDL
Cholesterol: 147 mg/dL (ref <200)
HDL: 31 mg/dL — ABNORMAL LOW (ref >=40)
LDL Cholesterol (Calc): 99 mg/dL (calc)
Non-HDL Cholesterol (Calc): 116 mg/dL (calc) (ref <130)
Total CHOL/HDL Ratio: 4.7 (calc) (ref <5.0)
Triglycerides: 78 mg/dL (ref <150)

## 2019-02-27 DIAGNOSIS — G4733 Obstructive sleep apnea (adult) (pediatric): Secondary | ICD-10-CM | POA: Diagnosis not present

## 2019-03-10 DIAGNOSIS — D649 Anemia, unspecified: Secondary | ICD-10-CM | POA: Diagnosis not present

## 2019-03-10 LAB — HEMOGLOBIN: Hemoglobin: 13.9 g/dL (ref 13.2–17.1)

## 2019-03-11 LAB — FERRITIN: Ferritin: 115 ng/mL (ref 38–380)

## 2019-03-11 LAB — HEMOGLOBIN AND HEMATOCRIT, BLOOD
HCT: 43.3 % (ref 38.5–50.0)
Hemoglobin: 14.2 g/dL (ref 13.2–17.1)

## 2019-03-16 ENCOUNTER — Other Ambulatory Visit: Payer: Self-pay | Admitting: Physician Assistant

## 2019-03-16 DIAGNOSIS — F909 Attention-deficit hyperactivity disorder, unspecified type: Secondary | ICD-10-CM

## 2019-03-18 ENCOUNTER — Encounter: Payer: Self-pay | Admitting: Physician Assistant

## 2019-03-18 MED ORDER — AMPHETAMINE-DEXTROAMPHETAMINE 20 MG PO TABS: 20.0000 mg | ORAL_TABLET | Freq: Two times a day (BID) | ORAL | 0 refills | Status: DC

## 2019-03-19 MED FILL — AMPHETAMINE-DEXTROAMPHETAMI: 20 | 30 days supply | Qty: 60 | Fill #0

## 2019-04-07 ENCOUNTER — Other Ambulatory Visit: Payer: Self-pay | Admitting: Physician Assistant

## 2019-04-07 DIAGNOSIS — J309 Allergic rhinitis, unspecified: Secondary | ICD-10-CM

## 2019-04-07 DIAGNOSIS — R0982 Postnasal drip: Secondary | ICD-10-CM

## 2019-04-07 MED FILL — MONTELUKAST SOD 10 MG TAB: 10 | 90 days supply | Qty: 90 | Fill #0

## 2019-04-10 ENCOUNTER — Other Ambulatory Visit: Payer: Self-pay | Admitting: Physician Assistant

## 2019-04-10 DIAGNOSIS — I10 Essential (primary) hypertension: Secondary | ICD-10-CM

## 2019-04-10 DIAGNOSIS — G8929 Other chronic pain: Secondary | ICD-10-CM

## 2019-04-10 DIAGNOSIS — E782 Mixed hyperlipidemia: Secondary | ICD-10-CM

## 2019-04-10 DIAGNOSIS — F909 Attention-deficit hyperactivity disorder, unspecified type: Secondary | ICD-10-CM

## 2019-04-10 MED ORDER — LISINOPRIL 20 MG PO TABS: 20.0000 mg | ORAL_TABLET | Freq: Every day | ORAL | 0 refills | Status: DC

## 2019-04-10 MED ORDER — AMPHETAMINE-DEXTROAMPHETAMINE 20 MG PO TABS: 20.0000 mg | ORAL_TABLET | Freq: Two times a day (BID) | ORAL | 0 refills | Status: DC

## 2019-04-10 MED ORDER — TIZANIDINE HCL 4 MG PO TABS: 4.0000 mg | ORAL_TABLET | Freq: Every evening | ORAL | 0 refills | Status: DC | PRN

## 2019-04-10 MED ORDER — ATORVASTATIN CALCIUM 20 MG PO TABS: 20.0000 mg | ORAL_TABLET | Freq: Every day | ORAL | 0 refills | Status: DC

## 2019-04-16 MED FILL — AMPHETAMINE-DEXTROAMPHETAMI: 20 | 30 days supply | Qty: 60 | Fill #0

## 2019-04-17 MED FILL — LISINOPRIL 20 MG TABLET: 20 | 90 days supply | Qty: 90 | Fill #0

## 2019-04-17 MED FILL — ATORVASTATIN 20 MG TABLET: 20 | 90 days supply | Qty: 90 | Fill #0

## 2019-04-17 MED FILL — tiZANidine HCL 4 MG TABS: 4 | 90 days supply | Qty: 90 | Fill #0

## 2019-05-13 ENCOUNTER — Other Ambulatory Visit: Payer: Self-pay | Admitting: Physician Assistant

## 2019-05-13 DIAGNOSIS — F909 Attention-deficit hyperactivity disorder, unspecified type: Secondary | ICD-10-CM

## 2019-05-14 MED ORDER — AMPHETAMINE-DEXTROAMPHETAMINE 20 MG PO TABS: 20.0000 mg | ORAL_TABLET | Freq: Two times a day (BID) | ORAL | 0 refills | Status: DC

## 2019-05-14 MED FILL — AMPHETAMINE-DEXTROAMPHETAMI: 20 | 30 days supply | Qty: 60 | Fill #0

## 2019-05-14 NOTE — Telephone Encounter (Signed)
Stephen Steele is requesting med refill for adderall.

## 2019-05-14 NOTE — Telephone Encounter (Signed)
Pt has been updated. As per pt, he has an upcoming appt to est care on 08/06/19 with Dr. Sheppard Coil. Aware future refills will be authorized by Dr. Sheppard Coil. No other inquiries during call.

## 2019-06-03 DIAGNOSIS — G4733 Obstructive sleep apnea (adult) (pediatric): Secondary | ICD-10-CM | POA: Diagnosis not present

## 2019-06-18 ENCOUNTER — Other Ambulatory Visit: Payer: Self-pay | Admitting: Physician Assistant

## 2019-06-18 DIAGNOSIS — F909 Attention-deficit hyperactivity disorder, unspecified type: Secondary | ICD-10-CM

## 2019-06-18 MED ORDER — AMPHETAMINE-DEXTROAMPHETAMINE 20 MG PO TABS: 20.0000 mg | ORAL_TABLET | Freq: Two times a day (BID) | ORAL | 0 refills | Status: DC

## 2019-06-18 MED FILL — AMPHETAMINE-DEXTROAMPHETAMI: 20 | 30 days supply | Qty: 60 | Fill #0

## 2019-06-18 NOTE — Telephone Encounter (Signed)
[facility] requesting med refill for amphetamine-dextro.

## 2019-07-22 ENCOUNTER — Other Ambulatory Visit: Payer: Self-pay | Admitting: Physician Assistant

## 2019-07-22 DIAGNOSIS — F909 Attention-deficit hyperactivity disorder, unspecified type: Secondary | ICD-10-CM

## 2019-07-22 MED ORDER — AMPHETAMINE-DEXTROAMPHETAMINE 20 MG PO TABS: 20.0000 mg | ORAL_TABLET | Freq: Two times a day (BID) | ORAL | 0 refills | Status: DC

## 2019-07-22 MED FILL — AMPHETAMINE-DEXTROAMPHETAMI: 20 | 30 days supply | Qty: 60 | Fill #0

## 2019-07-24 MED FILL — MONTELUKAST SOD 10 MG TAB: 10 | 90 days supply | Qty: 90 | Fill #1

## 2019-08-06 ENCOUNTER — Encounter: Payer: Self-pay | Admitting: Osteopathic Medicine

## 2019-08-06 ENCOUNTER — Ambulatory Visit: Payer: 59 | Admitting: Physician Assistant

## 2019-08-06 ENCOUNTER — Ambulatory Visit (INDEPENDENT_AMBULATORY_CARE_PROVIDER_SITE_OTHER): Payer: 59 | Admitting: Osteopathic Medicine

## 2019-08-06 VITALS — Temp 98.5°F | Wt 202.0 lb

## 2019-08-06 DIAGNOSIS — E782 Mixed hyperlipidemia: Secondary | ICD-10-CM | POA: Diagnosis not present

## 2019-08-06 DIAGNOSIS — R0982 Postnasal drip: Secondary | ICD-10-CM

## 2019-08-06 DIAGNOSIS — Z9989 Dependence on other enabling machines and devices: Secondary | ICD-10-CM

## 2019-08-06 DIAGNOSIS — I10 Essential (primary) hypertension: Secondary | ICD-10-CM | POA: Diagnosis not present

## 2019-08-06 DIAGNOSIS — G8929 Other chronic pain: Secondary | ICD-10-CM | POA: Diagnosis not present

## 2019-08-06 DIAGNOSIS — J309 Allergic rhinitis, unspecified: Secondary | ICD-10-CM

## 2019-08-06 DIAGNOSIS — G4733 Obstructive sleep apnea (adult) (pediatric): Secondary | ICD-10-CM | POA: Diagnosis not present

## 2019-08-06 DIAGNOSIS — M545 Low back pain, unspecified: Secondary | ICD-10-CM

## 2019-08-06 DIAGNOSIS — F909 Attention-deficit hyperactivity disorder, unspecified type: Secondary | ICD-10-CM

## 2019-08-06 DIAGNOSIS — G4710 Hypersomnia, unspecified: Secondary | ICD-10-CM

## 2019-08-06 MED ORDER — LISINOPRIL 20 MG PO TABS: 20.0000 mg | ORAL_TABLET | Freq: Every day | ORAL | 3 refills | Status: DC

## 2019-08-06 MED ORDER — AMPHETAMINE-DEXTROAMPHETAMINE 20 MG PO TABS: 20.0000 mg | ORAL_TABLET | Freq: Two times a day (BID) | ORAL | 0 refills | Status: DC

## 2019-08-06 MED ORDER — CETIRIZINE HCL 10 MG PO TABS: 10.0000 mg | ORAL_TABLET | Freq: Every day | ORAL | 3 refills | Status: DC

## 2019-08-06 MED ORDER — TIZANIDINE HCL 4 MG PO TABS: 4.0000 mg | ORAL_TABLET | Freq: Every evening | ORAL | 0 refills | Status: DC | PRN

## 2019-08-06 MED ORDER — ATORVASTATIN CALCIUM 20 MG PO TABS: 20.0000 mg | ORAL_TABLET | Freq: Every day | ORAL | 3 refills | Status: DC

## 2019-08-06 MED ORDER — FLUTICASONE PROPIONATE 50 MCG/ACT NA SUSP: 1.0000 | Freq: Every day | NASAL | 3 refills | Status: DC

## 2019-08-06 MED FILL — LISINOPRIL 20 MG TABLET: 20 | 30 days supply | Qty: 30 | Fill #0

## 2019-08-06 MED FILL — ATORVASTATIN 20 MG TABLET: 20 | 90 days supply | Qty: 90 | Fill #0

## 2019-08-06 MED FILL — FLUTICASONE PROP 50 MCG SPR: 50 | 30 days supply | Qty: 16 | Fill #0

## 2019-08-06 MED FILL — tiZANidine HCL 4 MG TABS: 4 | 90 days supply | Qty: 90 | Fill #0

## 2019-08-06 NOTE — Progress Notes (Signed)
Virtual Visit via Video (App used: [email or web address]) Note  I connected with      Stephen Steele on 08/06/19 at 3:50 PM  by a telemedicine application and verified that I am speaking with the correct person using two identifiers.  Patient is at home I am in office   I discussed the limitations of evaluation and management by telemedicine and the availability of in person appointments. The patient expressed understanding and agreed to proceed.  History of Present Illness: Stephen Steele is a 28 y.o. male who would like to discuss ADHD medications, HTN,   Patient reports doing well on current ADD medications.  No other complaints or concerns today.  Blood pressure as below, no chest pain, pressure, shortness of breath.  History of OSA, compliant with CPAP and benefiting from this.  Thinks he might also have history of hypersomnolence, he has some issues upon waking where he feels very out of it for the first few minutes.  If he allows himself some extra time to lie in bed and slowly wake up he seems to do fine.  He is definitely worse without the CPAP    Observations/Objective: Temp 98.5 F (36.9 C) (Oral)   Wt 202 lb (91.6 kg)   BMI 31.64 kg/m  BP Readings from Last 3 Encounters:  02/04/19 127/83  07/25/18 123/73  04/08/18 124/78   Exam: Normal Speech.  NAD  Lab and Radiology Results No results found for this or any previous visit (from the past 72 hour(s)). No results found.     Assessment and Plan: 28 y.o. male with The primary encounter diagnosis was Adult ADHD. Diagnoses of Mixed hyperlipidemia, Allergic rhinitis with postnasal drip, Hypertension goal BP (blood pressure) < 130/80, Chronic bilateral low back pain without sciatica, Hypersomnolence, and OSA on CPAP were also pertinent to this visit.   PDMP not reviewed this encounter. No orders of the defined types were placed in this encounter.  Meds ordered this encounter  Medications  . atorvastatin (LIPITOR) 20 MG  tablet    Sig: Take 1 tablet (20 mg total) by mouth at bedtime.    Dispense:  90 tablet    Refill:  3  . cetirizine (ZYRTEC) 10 MG tablet    Sig: Take 1 tablet (10 mg total) by mouth daily.    Dispense:  90 tablet    Refill:  3  . fluticasone (FLONASE) 50 MCG/ACT nasal spray    Sig: Place 1 spray into both nostrils daily.    Dispense:  48 g    Refill:  3  . lisinopril (ZESTRIL) 20 MG tablet    Sig: Take 1 tablet (20 mg total) by mouth daily.    Dispense:  90 tablet    Refill:  3  . tiZANidine (ZANAFLEX) 4 MG tablet    Sig: Take 1 tablet (4 mg total) by mouth at bedtime as needed for muscle spasms. TAKE 1 TABLET BY MOUTH ONCE A DAY AS NEEDED    Dispense:  90 tablet    Refill:  0  . amphetamine-dextroamphetamine (ADDERALL) 20 MG tablet    Sig: Take 1 tablet (20 mg total) by mouth 2 (two) times daily.    Dispense:  180 tablet    Refill:  0     Follow Up Instructions: Return in about 6 months (around 02/04/2020) for ANNUAL (call week prior to visit for lab orders).  Okay to refill ADHD medications until then, the patient is advised to make an appointment if he  feels that the medicines are working for him or if there are any other issues with meds    I discussed the assessment and treatment plan with the patient. The patient was provided an opportunity to ask questions and all were answered. The patient agreed with the plan and demonstrated an understanding of the instructions.   The patient was advised to call back or seek an in-person evaluation if any new concerns, if symptoms worsen or if the condition fails to improve as anticipated.  15 minutes of non-face-to-face time was provided during this encounter.      . . . . . . . . . . . . . Marland Kitchen                   Historical information moved to improve visibility of documentation.  Past Medical History:  Diagnosis Date  . Acute bilateral low back pain without sciatica 07/24/2017  . Adult ADHD  04/13/2017  . Allergy   . Chronic left shoulder pain   . Hypertension 04/13/2017  . Mixed hyperlipidemia 04/16/2017  . Obesity   . Pleural effusion on right 10/25/2017  . Tachycardia with heart rate 100-120 beats per minute 01/02/2018   Past Surgical History:  Procedure Laterality Date  . HAND SURGERY Right   . TONSILLECTOMY  1997   Social History   Tobacco Use  . Smoking status: Former Smoker    Years: 2.00    Types: Cigarettes    Quit date: 04/13/2012    Years since quitting: 7.3  . Smokeless tobacco: Never Used  Substance Use Topics  . Alcohol use: Yes    Alcohol/week: 7.0 standard drinks    Types: 7 Glasses of wine per week   family history includes Cancer in his paternal grandfather; Depression in his mother and sister; Heart attack in his paternal grandfather; Hypercholesterolemia in his father; Stroke in his paternal grandfather.  Medications: Current Outpatient Medications  Medication Sig Dispense Refill  . AMBULATORY NON FORMULARY MEDICATION 1 week download 1 each 0  . [START ON 08/20/2019] amphetamine-dextroamphetamine (ADDERALL) 20 MG tablet Take 1 tablet (20 mg total) by mouth 2 (two) times daily. 180 tablet 0  . atorvastatin (LIPITOR) 20 MG tablet Take 1 tablet (20 mg total) by mouth at bedtime. 90 tablet 3  . cetirizine (ZYRTEC) 10 MG tablet Take 1 tablet (10 mg total) by mouth daily. 90 tablet 3  . fluticasone (FLONASE) 50 MCG/ACT nasal spray Place 1 spray into both nostrils daily. 48 g 3  . lisinopril (ZESTRIL) 20 MG tablet Take 1 tablet (20 mg total) by mouth daily. 90 tablet 3  . montelukast (SINGULAIR) 10 MG tablet TAKE 1 TABLET BY MOUTH AT BEDTIME. 90 tablet 3  . tiZANidine (ZANAFLEX) 4 MG tablet Take 1 tablet (4 mg total) by mouth at bedtime as needed for muscle spasms. TAKE 1 TABLET BY MOUTH ONCE A DAY AS NEEDED 90 tablet 0   No current facility-administered medications for this visit.   No Known Allergies

## 2019-08-29 ENCOUNTER — Other Ambulatory Visit: Payer: Self-pay | Admitting: Osteopathic Medicine

## 2019-08-29 DIAGNOSIS — F909 Attention-deficit hyperactivity disorder, unspecified type: Secondary | ICD-10-CM

## 2019-08-29 MED ORDER — AMPHETAMINE-DEXTROAMPHETAMINE 20 MG PO TABS: 20.0000 mg | ORAL_TABLET | Freq: Two times a day (BID) | ORAL | 0 refills | Status: DC

## 2019-08-29 MED FILL — AMPHETAMINE-DEXTROAMPHETAMI: 20 | 30 days supply | Qty: 60 | Fill #0

## 2019-08-29 NOTE — Telephone Encounter (Signed)
..  PDMP reviewed during this encounter. Last fill 07/22/2019 Sent 30 days.

## 2019-09-03 DIAGNOSIS — G4733 Obstructive sleep apnea (adult) (pediatric): Secondary | ICD-10-CM | POA: Diagnosis not present

## 2019-09-18 MED FILL — LISINOPRIL 20 MG TABLET: 20 | 30 days supply | Qty: 30 | Fill #1

## 2019-10-02 ENCOUNTER — Other Ambulatory Visit: Payer: Self-pay | Admitting: Physician Assistant

## 2019-10-02 DIAGNOSIS — F909 Attention-deficit hyperactivity disorder, unspecified type: Secondary | ICD-10-CM

## 2019-10-02 MED ORDER — AMPHETAMINE-DEXTROAMPHETAMINE 20 MG PO TABS: 20.0000 mg | ORAL_TABLET | Freq: Two times a day (BID) | ORAL | 0 refills | Status: DC

## 2019-10-03 MED FILL — DEXTROAMP-AMPHETAMIN 20 MG: 20 | 90 days supply | Qty: 180 | Fill #0

## 2019-10-09 DIAGNOSIS — G4733 Obstructive sleep apnea (adult) (pediatric): Secondary | ICD-10-CM | POA: Diagnosis not present

## 2019-10-22 MED FILL — LISINOPRIL 20 MG TABLET: 20 | 30 days supply | Qty: 30 | Fill #2

## 2019-11-04 ENCOUNTER — Other Ambulatory Visit: Payer: Self-pay | Admitting: Osteopathic Medicine

## 2019-11-04 DIAGNOSIS — G8929 Other chronic pain: Secondary | ICD-10-CM

## 2019-11-04 MED FILL — ATORVASTATIN 20 MG TABLET: 20 | 90 days supply | Qty: 90 | Fill #1

## 2019-11-04 MED FILL — MONTELUKAST SOD 10 MG TAB: 10 | 90 days supply | Qty: 90 | Fill #2

## 2019-11-04 MED FILL — tiZANidine HCL 4 MG TABS: 4 | 90 days supply | Qty: 90 | Fill #0

## 2019-11-04 NOTE — Telephone Encounter (Signed)
Please review for refill- patient at [facility] 

## 2019-11-05 ENCOUNTER — Other Ambulatory Visit: Payer: Self-pay

## 2019-11-05 DIAGNOSIS — E782 Mixed hyperlipidemia: Secondary | ICD-10-CM

## 2019-11-05 MED ORDER — ATORVASTATIN CALCIUM 20 MG PO TABS: 20.0000 mg | ORAL_TABLET | Freq: Every day | ORAL | 1 refills | Status: DC

## 2019-11-20 MED FILL — LISINOPRIL 20 MG TABLET: 20 | 30 days supply | Qty: 30 | Fill #3

## 2019-12-27 ENCOUNTER — Other Ambulatory Visit: Payer: Self-pay

## 2019-12-27 ENCOUNTER — Emergency Department
Admission: EM | Admit: 2019-12-27 | Discharge: 2019-12-27 | Disposition: A | Discharge status: Home / Self Care | Payer: 59 | Source: Home / Self Care | Attending: Family Medicine | Admitting: Family Medicine

## 2019-12-27 ENCOUNTER — Encounter: Payer: Self-pay | Admitting: Emergency Medicine

## 2019-12-27 DIAGNOSIS — H9201 Otalgia, right ear: Secondary | ICD-10-CM

## 2019-12-27 DIAGNOSIS — H6981 Other specified disorders of Eustachian tube, right ear: Secondary | ICD-10-CM

## 2019-12-27 MED ORDER — AMOXICILLIN 875 MG PO TABS: 875.0000 mg | ORAL_TABLET | Freq: Two times a day (BID) | ORAL | 0 refills | Status: DC

## 2019-12-27 MED ORDER — PREDNISONE 20 MG PO TABS: ORAL_TABLET | ORAL | 0 refills | Status: DC

## 2019-12-27 NOTE — Discharge Instructions (Addendum)
May take Pseudoephedrine (30mg , one or two every 4 to 6 hours) for sinus congestion.  Get adequate rest.

## 2019-12-27 NOTE — ED Provider Notes (Signed)
[facility]    CSN: [id number] Arrival date & time: 12/27/19  1252      History   Chief Complaint Chief Complaint  Patient presents with  . Otalgia    HPI Stephen Steele is a 29 y.o. male.   Patient complains of mild discomfort in his right ear for about 6 days.  After wearing a headset last night his pain became acutely worse today.  His right ear feels full with decreased hearing.  He denies nasal congestion, recent cold, and no fever.  The history is provided by the patient.  Otalgia Location:  Right Behind ear:  No abnormality Quality:  Dull Severity:  Mild Onset quality:  Gradual Duration:  6 days Timing:  Constant Progression:  Worsening Chronicity:  New Context: not direct blow, not elevation change, not foreign body in ear, not loud noise, not recent URI and not water in ear   Relieved by:  Nothing Worsened by:  Nothing Ineffective treatments:  None tried Associated symptoms: no congestion, no cough, no ear discharge, no fever, no headaches, no hearing loss, no neck pain, no rash, no rhinorrhea, no sore throat and no tinnitus   Risk factors: no recent travel     Past Medical History:  Diagnosis Date  . Acute bilateral low back pain without sciatica 07/24/2017  . Adult ADHD 04/13/2017  . Allergy   . Chronic left shoulder pain   . Hypertension 04/13/2017  . Mixed hyperlipidemia 04/16/2017  . Obesity   . Pleural effusion on right 10/25/2017  . Tachycardia with heart rate 100-120 beats per minute 01/02/2018    Patient Active Problem List   Diagnosis Date Noted  . Low hemoglobin 02/05/2019  . Hypersomnolence 02/04/2019  . OSA on CPAP 02/04/2019  . Chronic bilateral low back pain without sciatica 02/04/2019  . Mild obstructive sleep apnea 07/29/2018  . Pulmonary nodules 07/29/2018  . Allergic rhinitis with postnasal drip 04/08/2018  . Snoring 01/02/2018  . Encounter for medication management in attention deficit hyperactivity disorder (ADHD)  07/24/2017  . Mixed hyperlipidemia 04/16/2017  . Adult ADHD 04/13/2017  . Class 1 obesity due to excess calories with serious comorbidity and body mass index (BMI) of 30.0 to 30.9 in adult 04/13/2017  . Hypertension goal BP (blood pressure) < 130/80 04/13/2017  . Wears contact lenses 04/13/2017  . Electronic cigarette use 04/13/2017    Past Surgical History:  Procedure Laterality Date  . HAND SURGERY Right   . TONSILLECTOMY  1997       Home Medications    Prior to Admission medications   Medication Sig Start Date End Date Taking? Authorizing Provider  amphetamine-dextroamphetamine (ADDERALL) 20 MG tablet Take 1 tablet (20 mg total) by mouth 2 (two) times daily. 10/02/19 12/31/19 Yes Sunnie Nielsen, DO  atorvastatin (LIPITOR) 20 MG tablet Take 1 tablet (20 mg total) by mouth at bedtime. 11/05/19  Yes Sunnie Nielsen, DO  cetirizine (ZYRTEC) 10 MG tablet Take 1 tablet (10 mg total) by mouth daily. 08/06/19  Yes Sunnie Nielsen, DO  fluticasone (FLONASE) 50 MCG/ACT nasal spray Place 1 spray into both nostrils daily. 08/06/19  Yes Sunnie Nielsen, DO  lisinopril (ZESTRIL) 20 MG tablet Take 1 tablet (20 mg total) by mouth daily. 08/06/19  Yes Sunnie Nielsen, DO  montelukast (SINGULAIR) 10 MG tablet TAKE 1 TABLET BY MOUTH AT BEDTIME. 04/07/19  Yes Carlis Stable, PA-C  tiZANidine (ZANAFLEX) 4 MG tablet TAKE 1 TABLET (4 MG TOTAL) BY MOUTH AT BEDTIME AS NEEDED FOR MUSCLE  SPASMS. 11/04/19  Yes Sunnie Nielsen, DO  AMBULATORY NON FORMULARY MEDICATION 1 week download 07/25/18   Carlis Stable, PA-C  amoxicillin (AMOXIL) 875 MG tablet Take 1 tablet (875 mg total) by mouth 2 (two) times daily. 12/27/19   Lattie Haw, MD  predniSONE (DELTASONE) 20 MG tablet Take one tab by mouth twice daily for 4 days, then one daily for 3 days. Take with food. 12/27/19   Lattie Haw, MD    Family History Family History  Problem Relation Age of Onset  . Depression  Mother   . Hypercholesterolemia Father   . Depression Sister   . Stroke Paternal Grandfather   . Cancer Paternal Grandfather   . Heart attack Paternal Grandfather     Social History Social History   Tobacco Use  . Smoking status: Former Smoker    Years: 2.00    Types: Cigarettes    Quit date: 04/13/2012    Years since quitting: 7.7  . Smokeless tobacco: Never Used  Substance Use Topics  . Alcohol use: Yes    Alcohol/week: 7.0 standard drinks    Types: 7 Glasses of wine per week  . Drug use: Yes     Allergies   Patient has no known allergies.   Review of Systems Review of Systems  Constitutional: Negative for chills, diaphoresis and fever.  HENT: Positive for ear pain. Negative for congestion, ear discharge, hearing loss, rhinorrhea, sinus pressure, sneezing, sore throat and tinnitus.   Respiratory: Negative for cough.   Musculoskeletal: Negative for neck pain.  Skin: Negative for rash.  Neurological: Negative for headaches.  All other systems reviewed and are negative.    Physical Exam Triage Vital Signs ED Triage Vitals  Enc Vitals Group     BP 12/27/19 1300 123/82     Pulse Rate 12/27/19 1300 (!) 103     Resp --      Temp 12/27/19 1300 98 F (36.7 C)     Temp Source 12/27/19 1300 Oral     SpO2 12/27/19 1300 99 %     Weight --      Height --      Head Circumference --      Peak Flow --      Pain Score 12/27/19 1301 7     Pain Loc --      Pain Edu? --      Excl. in GC? --    No data found.  Updated Vital Signs BP 123/82 (BP Location: Left Arm)   Pulse (!) 103   Temp 98 F (36.7 C) (Oral)   SpO2 99%   Visual Acuity Right Eye Distance:   Left Eye Distance:   Bilateral Distance:    Right Eye Near:   Left Eye Near:    Bilateral Near:     Physical Exam Vitals and nursing note reviewed.  Constitutional:      General: He is not in acute distress. HENT:     Head: Normocephalic.     Right Ear: Tympanic membrane, ear canal and external ear  normal. There is no impacted cerumen.     Left Ear: Tympanic membrane, ear canal and external ear normal. There is no impacted cerumen.     Ears:     Comments: Right canal contains small amount of cerumen, but visualized portion of right tympanic membrane appears normal. Mild right temporomandibular joint tenderness present.    Nose: Nose normal.     Mouth/Throat:     Pharynx:  Oropharynx is clear.  Eyes:     Pupils: Pupils are equal, round, and reactive to light.  Cardiovascular:     Rate and Rhythm: Tachycardia present.  Pulmonary:     Effort: Pulmonary effort is normal.  Lymphadenopathy:     Cervical: No cervical adenopathy.  Skin:    General: Skin is warm and dry.  Neurological:     Mental Status: He is alert.      UC Treatments / Results  Labs (all labs ordered are listed, but only abnormal results are displayed) Labs Reviewed -  Tympanometry:  Right ear tympanogram low peak height ("flat line"), normal ear volume; Left ear tympanogram wide  EKG   Radiology No results found.  Procedures Procedures (including critical care time)  Medications Ordered in UC Medications - No data to display  Initial Impression / Assessment and Plan / UC Course  I have reviewed the triage vital signs and the nursing notes.  Pertinent labs & imaging results that were available during my care of the patient were reviewed by me and considered in my medical decision making (see chart for details).    ?otitis media.  Note that right tympanic membrane looks relatively normal.  Begin empiric amoxicillin and prednisone burst/taper. Followup with ENT if not improved 10 days.   Final Clinical Impressions(s) / UC Diagnoses   Final diagnoses:  Otalgia of right ear  Eustachian tube dysfunction, right     Discharge Instructions     May take Pseudoephedrine (30mg , one or two every 4 to 6 hours) for sinus congestion.  Get adequate rest.          ED Prescriptions    Medication Sig  Dispense Auth. Provider   amoxicillin (AMOXIL) 875 MG tablet Take 1 tablet (875 mg total) by mouth 2 (two) times daily. 20 tablet Kandra Nicolas, MD   predniSONE (DELTASONE) 20 MG tablet Take one tab by mouth twice daily for 4 days, then one daily for 3 days. Take with food. 11 tablet Kandra Nicolas, MD        Kandra Nicolas, MD 12/27/19 [phone]

## 2019-12-27 NOTE — ED Triage Notes (Signed)
Patient c/o right ear pain for 5 days, worse this am, no drainage in right ear, no cold sx's, afebrile, no injury to ear.

## 2020-01-05 DIAGNOSIS — G4733 Obstructive sleep apnea (adult) (pediatric): Secondary | ICD-10-CM | POA: Diagnosis not present

## 2020-01-06 ENCOUNTER — Other Ambulatory Visit: Payer: Self-pay | Admitting: Osteopathic Medicine

## 2020-01-06 DIAGNOSIS — F909 Attention-deficit hyperactivity disorder, unspecified type: Secondary | ICD-10-CM

## 2020-01-06 DIAGNOSIS — G8929 Other chronic pain: Secondary | ICD-10-CM

## 2020-01-06 NOTE — Telephone Encounter (Signed)
[facility] pharmacy requesting med refills for amphetamine and tizanidine.

## 2020-01-07 MED ORDER — AMPHETAMINE-DEXTROAMPHETAMINE 20 MG PO TABS: 20.0000 mg | ORAL_TABLET | Freq: Two times a day (BID) | ORAL | 0 refills | Status: DC

## 2020-01-07 MED ORDER — TIZANIDINE HCL 4 MG PO TABS: 4.0000 mg | ORAL_TABLET | Freq: Every evening | ORAL | 0 refills | Status: DC | PRN

## 2020-01-08 MED FILL — AMPHETAMINE-DEXTROAMPHETAMI: 20 | 90 days supply | Qty: 180 | Fill #0

## 2020-01-20 MED FILL — LISINOPRIL 20 MG TABLET: 20 | 30 days supply | Qty: 30 | Fill #5

## 2020-02-03 MED FILL — tiZANidine HCL 4 MG TABS: 4 | 90 days supply | Qty: 90 | Fill #0

## 2020-02-03 MED FILL — MONTELUKAST SOD 10 MG TAB: 10 | 90 days supply | Qty: 90 | Fill #3

## 2020-02-17 MED FILL — LISINOPRIL 20 MG TABLET: 20 | 30 days supply | Qty: 30 | Fill #6

## 2020-02-17 MED FILL — ATORVASTATIN 20 MG TABLET: 20 | 90 days supply | Qty: 90 | Fill #2

## 2020-03-17 ENCOUNTER — Encounter: Payer: 59 | Admitting: Osteopathic Medicine

## 2020-03-29 ENCOUNTER — Other Ambulatory Visit: Payer: Self-pay | Admitting: Osteopathic Medicine

## 2020-03-29 ENCOUNTER — Ambulatory Visit (INDEPENDENT_AMBULATORY_CARE_PROVIDER_SITE_OTHER): Payer: 59 | Admitting: Osteopathic Medicine

## 2020-03-29 ENCOUNTER — Encounter: Payer: Self-pay | Admitting: Osteopathic Medicine

## 2020-03-29 VITALS — BP 102/68 | HR 98 | Temp 98.0°F | Ht 68.0 in | Wt 217.0 lb

## 2020-03-29 DIAGNOSIS — Z0000 Encounter for general adult medical examination without abnormal findings: Secondary | ICD-10-CM

## 2020-03-29 DIAGNOSIS — G4733 Obstructive sleep apnea (adult) (pediatric): Secondary | ICD-10-CM

## 2020-03-29 DIAGNOSIS — E782 Mixed hyperlipidemia: Secondary | ICD-10-CM

## 2020-03-29 DIAGNOSIS — F909 Attention-deficit hyperactivity disorder, unspecified type: Secondary | ICD-10-CM

## 2020-03-29 DIAGNOSIS — I10 Essential (primary) hypertension: Secondary | ICD-10-CM

## 2020-03-29 DIAGNOSIS — R0982 Postnasal drip: Secondary | ICD-10-CM | POA: Diagnosis not present

## 2020-03-29 DIAGNOSIS — J309 Allergic rhinitis, unspecified: Secondary | ICD-10-CM

## 2020-03-29 DIAGNOSIS — Z9989 Dependence on other enabling machines and devices: Secondary | ICD-10-CM | POA: Diagnosis not present

## 2020-03-29 LAB — COMPLETE METABOLIC PANEL WITH GFR
AG Ratio: 2.8 (calc) — ABNORMAL HIGH (ref 1.0–2.5)
ALT: 46 U/L (ref 9–46)
AST: 19 U/L (ref 10–40)
Albumin: 4.8 g/dL (ref 3.6–5.1)
Alkaline phosphatase (APISO): 63 U/L (ref 36–130)
BUN: 16 mg/dL (ref 7–25)
CO2: 27 mmol/L (ref 20–32)
Calcium: 9.5 mg/dL (ref 8.6–10.3)
Chloride: 102 mmol/L (ref 98–110)
Creat: 1.17 mg/dL (ref 0.60–1.35)
GFR, Est African American: 98 mL/min/{1.73_m2} (ref >=60)
GFR, Est Non African American: 84 mL/min/{1.73_m2} (ref >=60)
Globulin: 1.7 g/dL (calc) — ABNORMAL LOW (ref 1.9–3.7)
Glucose, Bld: 82 mg/dL (ref 65–139)
Potassium: 4.2 mmol/L (ref 3.5–5.3)
Sodium: 137 mmol/L (ref 135–146)
Total Bilirubin: 0.4 mg/dL (ref 0.2–1.2)
Total Protein: 6.5 g/dL (ref 6.1–8.1)

## 2020-03-29 LAB — CBC
HCT: 40 % (ref 38.5–50.0)
Hemoglobin: 13.5 g/dL (ref 13.2–17.1)
MCH: 30.9 pg (ref 27.0–33.0)
MCHC: 33.8 g/dL (ref 32.0–36.0)
MCV: 91.5 fL (ref 80.0–100.0)
MPV: 9.5 fL (ref 7.5–12.5)
Platelets: 254 10*3/uL (ref 140–400)
RBC: 4.37 10*6/uL (ref 4.20–5.80)
RDW: 12.3 % (ref 11.0–15.0)
WBC: 5 10*3/uL (ref 3.8–10.8)

## 2020-03-29 LAB — LIPID PANEL
Cholesterol: 140 mg/dL (ref <200)
HDL: 29 mg/dL — ABNORMAL LOW (ref >=40)
LDL Cholesterol (Calc): 93 mg/dL (calc)
Non-HDL Cholesterol (Calc): 111 mg/dL (calc) (ref <130)
Total CHOL/HDL Ratio: 4.8 (calc) (ref <5.0)
Triglycerides: 87 mg/dL (ref <150)

## 2020-03-29 MED ORDER — AMPHETAMINE-DEXTROAMPHETAMINE 20 MG PO TABS: 20.0000 mg | ORAL_TABLET | Freq: Two times a day (BID) | ORAL | 0 refills | Status: DC

## 2020-03-29 MED ORDER — MONTELUKAST SODIUM 10 MG PO TABS: 10.0000 mg | ORAL_TABLET | Freq: Every day | ORAL | 3 refills | Status: DC

## 2020-03-29 NOTE — Progress Notes (Signed)
Stephen Steele is a 29 y.o. male who presents to  [facility] at [facility]  today, 03/29/20, seeking care for the following:  . Annual physical . No concerns . He and wife are trying for a baby! Just started trying . 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 Diagnoses of Mixed hyperlipidemia, Hypertension goal BP (blood pressure) < 130/80, OSA on CPAP, Adult ADHD, and Allergic rhinitis with postnasal drip were also pertinent to this visit.   No results found for this or any previous visit (from the past 24 hour(s)).     Patient Instructions  General Preventive Care  Most recent routine screening labs: ordered today.   Blood pressure goal 130/80 or less.   Tobacco: don't! Alcohol: responsible moderation is ok for most adults - if you have concerns about your alcohol intake, please talk to me!   Exercise: as tolerated to reduce risk of cardiovascular disease and diabetes. Strength training will also prevent osteoporosis.   Mental health: if need for mental health care (medicines, counseling, other), or concerns about moods, please let me know!   Sexual / Reproductive health: if need for STD testing, or if concerns with libido/pain problems, please let me know!   Advanced Directive: Living Will and/or Healthcare Power of Attorney recommended for all adults, regardless of age or health.  Vaccines  Flu vaccine: every fall.   Shingles vaccine: after age 76  Pneumonia vaccines: after age 62  Tetanus booster: every 10 years   HPV vaccine: Gardasil up to age 90 to prevent HPV-associated diseases, including certain cancers.   COVID vaccine: THANKS for getting your vaccine! :)  Cancer screenings   Colon cancer screening: for everyone age 37-75.    Prostate cancer screening: PSA blood test age  26-71  Lung cancer screening: not needed for non-smokers  Infection screenings  . HIV: recommended screening at least once age 60-65 . Gonorrhea/Chlamydia: screening as needed . Hepatitis C: recommended once for everyone age 58-75 . TB: certain at-risk populations, or depending on work requirements and/or travel history Other . Abdominal Aortic Aneurysm: screening with ultrasound recommended once for men age 19-75 who have ever smoked    Orders Placed This Encounter  Procedures  . CBC  . COMPLETE METABOLIC PANEL WITH GFR  . Lipid panel    Meds ordered this encounter  Medications  . amphetamine-dextroamphetamine (ADDERALL) 20 MG tablet    Sig: Take 1 tablet (20 mg total) by mouth 2 (two) times daily.    Dispense:  180 tablet    Refill:  0  . montelukast (SINGULAIR) 10 MG tablet    Sig: Take 1 tablet (10 mg total) by mouth at bedtime.    Dispense:  90 tablet    Refill:  3   Constitutional:  . VSS, see nurse notes . General Appearance: alert, well-developed, well-nourished, NAD Eyes: Marland Kitchen Normal lids and conjunctive, non-icteric sclera . PERRLA Ears, Nose, Mouth, Throat: . Normal appearance . Normal external auditory canal and TM bilaterally . Mask over nose/mouth Neck: . No masses, trachea midline . No thyroid enlargement/tenderness/mass appreciated Respiratory: . Normal respiratory effort . No dullness/hyper-resonance to percussion . Breath sounds normal, no wheeze/rhonchi/rales Cardiovascular: . S1/S2 normal, no murmur/rub/gallop auscultated . No lower extremity edema Gastrointestinal: . Nontender, no masses . No hepatomegaly, no splenomegaly . No hernia appreciated Musculoskeletal:  .  Gait normal . No clubbing/cyanosis of digits Neurological: . No cranial nerve deficit on limited exam . Motor and sensation intact and symmetric Psychiatric: . Normal judgment/insight . Normal mood and affect     Follow-up instructions: Return in about 6 months (around  09/29/2020) for MAINTAIN ADD RX, SEE [place] SOONER IF NEEDED .                                         All questions at time of visit were answered - patient instructed to contact office with any additional concerns or updates.  ER/RTC precautions were reviewed with the patient as applicable.   Please note: voice recognition software was used to produce this document, and typos may escape review. Please contact Dr. Lyn Hollingshead for any needed clarifications.

## 2020-03-29 NOTE — Patient Instructions (Addendum)
General Preventive Care  Most recent routine screening labs: ordered today.   Blood pressure goal 130/80 or less.   Tobacco: don't! Alcohol: responsible moderation is ok for most adults - if you have concerns about your alcohol intake, please talk to me!   Exercise: as tolerated to reduce risk of cardiovascular disease and diabetes. Strength training will also prevent osteoporosis.   Mental health: if need for mental health care (medicines, counseling, other), or concerns about moods, please let me know!   Sexual / Reproductive health: if need for STD testing, or if concerns with libido/pain problems, please let me know!   Advanced Directive: Living Will and/or Healthcare Power of Attorney recommended for all adults, regardless of age or health.  Vaccines  Flu vaccine: every fall.   Shingles vaccine: after age 42  Pneumonia vaccines: after age 66  Tetanus booster: every 10 years   HPV vaccine: Gardasil up to age 42 to prevent HPV-associated diseases, including certain cancers.   COVID vaccine: THANKS for getting your vaccine! :)  Cancer screenings   Colon cancer screening: for everyone age 98-75.    Prostate cancer screening: PSA blood test age 27-71  Lung cancer screening: not needed for non-smokers  Infection screenings  . HIV: recommended screening at least once age 10-65 . Gonorrhea/Chlamydia: screening as needed . Hepatitis C: recommended once for everyone age 1-75 . TB: certain at-risk populations, or depending on work requirements and/or travel history Other . Abdominal Aortic Aneurysm: screening with ultrasound recommended once for men age 86-75 who have ever smoked

## 2020-03-31 DIAGNOSIS — G4733 Obstructive sleep apnea (adult) (pediatric): Secondary | ICD-10-CM | POA: Diagnosis not present

## 2020-04-12 MED FILL — AMPHETAMINE-DEXTROAMPHETAMI: 20 | 90 days supply | Qty: 180 | Fill #0

## 2020-04-12 MED FILL — LISINOPRIL 20 MG TABLET: 20 | 30 days supply | Qty: 30 | Fill #7

## 2020-04-15 MED FILL — MONTELUKAST SOD 10 MG TAB: 10 | 90 days supply | Qty: 90 | Fill #0

## 2020-05-04 ENCOUNTER — Other Ambulatory Visit: Payer: Self-pay | Admitting: Osteopathic Medicine

## 2020-05-04 DIAGNOSIS — M545 Low back pain, unspecified: Secondary | ICD-10-CM

## 2020-05-04 DIAGNOSIS — G8929 Other chronic pain: Secondary | ICD-10-CM

## 2020-05-04 MED FILL — tiZANidine HCL 4 MG TABS: 4 | 90 days supply | Qty: 90 | Fill #0

## 2020-05-04 MED FILL — MONTELUKAST SOD 10 MG TAB: 10 | 90 days supply | Qty: 90 | Fill #0

## 2020-05-04 NOTE — Telephone Encounter (Signed)
Last refill - 01/07/2020 Last ov- 03/29/2020

## 2020-05-13 MED FILL — LISINOPRIL 20 MG TABLET: 20 | 30 days supply | Qty: 30 | Fill #8

## 2020-05-18 DIAGNOSIS — G4733 Obstructive sleep apnea (adult) (pediatric): Secondary | ICD-10-CM | POA: Diagnosis not present

## 2020-05-27 MED FILL — ATORVASTATIN CALCIUM 20 MG: 20 | 90 days supply | Qty: 90 | Fill #3

## 2020-06-24 MED FILL — LISINOPRIL 20 MG TABS: 20 | 30 days supply | Qty: 30 | Fill #9

## 2020-07-14 ENCOUNTER — Other Ambulatory Visit: Payer: Self-pay | Admitting: Osteopathic Medicine

## 2020-07-14 DIAGNOSIS — F909 Attention-deficit hyperactivity disorder, unspecified type: Secondary | ICD-10-CM

## 2020-07-14 MED ORDER — AMPHETAMINE-DEXTROAMPHETAMINE 20 MG PO TABS: 20.0000 mg | ORAL_TABLET | Freq: Two times a day (BID) | ORAL | 0 refills | Status: DC

## 2020-07-14 MED FILL — AMPHETAMINE-DEXTROAMPHETAMI: 20 | 90 days supply | Qty: 180 | Fill #0

## 2020-07-14 NOTE — Telephone Encounter (Signed)
Last appt 03/29/20 and AVS states "Return in about 6 months (around 09/29/2020) for MAINTAIN ADD RX, SEE [place] SOONER IF NEEDED ."  Last RX sent 03/29/20 for # 180 1 BID

## 2020-08-02 ENCOUNTER — Other Ambulatory Visit: Payer: Self-pay | Admitting: Osteopathic Medicine

## 2020-08-02 DIAGNOSIS — G8929 Other chronic pain: Secondary | ICD-10-CM

## 2020-08-02 MED FILL — MONTELUKAST SOD 10 MG TAB: 10 | 90 days supply | Qty: 90 | Fill #1

## 2020-08-02 MED FILL — LISINOPRIL 20 MG TABS: 20 | 30 days supply | Qty: 30 | Fill #10

## 2020-08-03 ENCOUNTER — Other Ambulatory Visit: Payer: Self-pay | Admitting: Osteopathic Medicine

## 2020-08-03 ENCOUNTER — Other Ambulatory Visit: Payer: Self-pay

## 2020-08-03 DIAGNOSIS — G8929 Other chronic pain: Secondary | ICD-10-CM

## 2020-08-03 MED ORDER — TIZANIDINE HCL 4 MG PO TABS: 4.0000 mg | ORAL_TABLET | Freq: Every evening | ORAL | 0 refills | Status: DC | PRN

## 2020-08-03 MED FILL — tiZANidine HCL 4 MG TABS: 4 | 90 days supply | Qty: 90 | Fill #0

## 2020-08-03 NOTE — Telephone Encounter (Signed)
Routing to covering provider.  °

## 2020-08-20 DIAGNOSIS — G4733 Obstructive sleep apnea (adult) (pediatric): Secondary | ICD-10-CM | POA: Diagnosis not present

## 2020-09-01 ENCOUNTER — Other Ambulatory Visit: Payer: Self-pay | Admitting: Osteopathic Medicine

## 2020-09-01 ENCOUNTER — Other Ambulatory Visit: Payer: Self-pay

## 2020-09-01 DIAGNOSIS — I10 Essential (primary) hypertension: Secondary | ICD-10-CM

## 2020-09-01 DIAGNOSIS — E782 Mixed hyperlipidemia: Secondary | ICD-10-CM

## 2020-09-01 MED ORDER — ATORVASTATIN CALCIUM 20 MG PO TABS: 20.0000 mg | ORAL_TABLET | Freq: Every day | ORAL | 1 refills | Status: DC

## 2020-09-01 MED FILL — ATORVASTATIN CALCIUM 20 MG: 20 | 90 days supply | Qty: 90 | Fill #0

## 2020-09-01 MED FILL — LISINOPRIL 20 MG TABLET: 20 | 30 days supply | Qty: 30 | Fill #0

## 2020-10-04 MED FILL — LISINOPRIL 20 MG TABS: 20 | 90 days supply | Qty: 90 | Fill #1

## 2020-10-12 ENCOUNTER — Ambulatory Visit: Payer: 59 | Admitting: Osteopathic Medicine

## 2020-10-13 ENCOUNTER — Ambulatory Visit: Payer: 59 | Admitting: Osteopathic Medicine

## 2020-10-26 ENCOUNTER — Other Ambulatory Visit: Payer: Self-pay

## 2020-10-26 ENCOUNTER — Other Ambulatory Visit: Payer: Self-pay | Admitting: Osteopathic Medicine

## 2020-10-26 ENCOUNTER — Encounter: Payer: Self-pay | Admitting: Osteopathic Medicine

## 2020-10-26 ENCOUNTER — Ambulatory Visit: Payer: 59 | Admitting: Osteopathic Medicine

## 2020-10-26 VITALS — BP 121/80 | HR 93 | Temp 98.6°F | Wt 204.0 lb

## 2020-10-26 DIAGNOSIS — F909 Attention-deficit hyperactivity disorder, unspecified type: Secondary | ICD-10-CM | POA: Diagnosis not present

## 2020-10-26 DIAGNOSIS — E782 Mixed hyperlipidemia: Secondary | ICD-10-CM | POA: Diagnosis not present

## 2020-10-26 DIAGNOSIS — I10 Essential (primary) hypertension: Secondary | ICD-10-CM | POA: Diagnosis not present

## 2020-10-26 DIAGNOSIS — Z9989 Dependence on other enabling machines and devices: Secondary | ICD-10-CM

## 2020-10-26 DIAGNOSIS — G4733 Obstructive sleep apnea (adult) (pediatric): Secondary | ICD-10-CM

## 2020-10-26 MED ORDER — AMPHETAMINE-DEXTROAMPHETAMINE 20 MG PO TABS: 20.0000 mg | ORAL_TABLET | Freq: Two times a day (BID) | ORAL | 0 refills | Status: DC

## 2020-10-26 MED ORDER — LISINOPRIL 20 MG PO TABS: 20.0000 mg | ORAL_TABLET | Freq: Every day | ORAL | 3 refills | Status: DC

## 2020-10-26 MED ORDER — AMBULATORY NON FORMULARY MEDICATION: 99 refills | Status: AC

## 2020-10-26 MED FILL — AMPHETAMINE-DEXTROAMPHETAMI: 20 | 90 days supply | Qty: 180 | Fill #0

## 2020-10-26 NOTE — Progress Notes (Signed)
Stephen Steele is a 30 y.o. male who presents to  [facility] at [facility]  today, 10/26/20, seeking care for the following:  Marland Kitchen Maintain ADHD Rx - no concerns today!  . Wife is pregnant with their first. Tdap UTD, all questions answered  . CPAP recall - pt not sure next steps      ASSESSMENT & PLAN with other pertinent findings:  The primary encounter diagnosis was OSA on CPAP - using consistently and beenfiting form treatment . Diagnoses of Adult ADHD, Hypertension goal BP (blood pressure) < 130/80, and Mixed hyperlipidemia were also pertinent to this visit.   --> Rx new CPAP equipment  --> refill Rx   There are no Patient Instructions on file for this visit.  No orders of the defined types were placed in this encounter.   Meds ordered this encounter  Medications  . DISCONTD: amphetamine-dextroamphetamine (ADDERALL) 20 MG tablet    Sig: Take 1 tablet (20 mg total) by mouth 2 (two) times daily.    Dispense:  180 tablet    Refill:  0  . lisinopril (ZESTRIL) 20 MG tablet    Sig: Take 1 tablet (20 mg total) by mouth daily.    Dispense:  90 tablet    Refill:  3  . amphetamine-dextroamphetamine (ADDERALL) 20 MG tablet    Sig: Take 1 tablet (20 mg total) by mouth 2 (two) times daily.    Dispense:  180 tablet    Refill:  0  . AMBULATORY NON FORMULARY MEDICATION    Sig: Supply ordered: CPAP and other supplies needed (headgear, cushions, filters, heated tuubing and water chamber) Dx: obstructive sleep apnea Settings: auto-titration 5-20 cmH2O    Dispense:  1 Units    Refill:  99     See below for relevant physical exam findings  See below for recent lab and imaging results reviewed  Medications, allergies, PMH, PSH, SocH, FamH reviewed below    Follow-up instructions: Return in about 6 months (around 04/28/2021) for ANNUAL (call week prior to visit for lab  orders).                                        Exam:  BP 121/80 (BP Location: Left Arm, Patient Position: Sitting, Cuff Size: Large)   Pulse 93   Temp 98.6 F (37 C) (Oral)   Wt 204 lb 0.6 oz (92.6 kg)   BMI 31.02 kg/m   Constitutional: VS see above. General Appearance: alert, well-developed, well-nourished, NAD  Neck: No masses, trachea midline.   Respiratory: Normal respiratory effort. no wheeze, no rhonchi, no rales  Cardiovascular: S1/S2 normal, no murmur, no rub/gallop auscultated. RRR.   Musculoskeletal: Gait normal. Symmetric and independent movement of all extremities  Neurological: Normal balance/coordination. No tremor.  Skin: warm, dry, intact.   Psychiatric: Normal judgment/insight. Normal mood and affect. Oriented x3.   Current Meds  Medication Sig  . AMBULATORY NON FORMULARY MEDICATION Supply ordered: CPAP and other supplies needed (headgear, cushions, filters, heated tuubing and water chamber) Dx: obstructive sleep apnea Settings: auto-titration 5-20 cmH2O  . atorvastatin (LIPITOR) 20 MG tablet Take 1 tablet (20 mg total) by mouth at bedtime.  . fluticasone (FLONASE) 50 MCG/ACT nasal spray Place 1 spray into both nostrils daily.  . montelukast (SINGULAIR) 10 MG tablet Take 1 tablet (10 mg total) by mouth at bedtime.  . [DISCONTINUED] lisinopril (  ZESTRIL) 20 MG tablet TAKE 1 TABLET (20 MG TOTAL) BY MOUTH DAILY.  . [DISCONTINUED] tiZANidine (ZANAFLEX) 4 MG tablet TAKE 1 TABLET BY MOUTH AT BEDTIME AS NEEDED FOR MUSCLE SPASMS.    No Known Allergies  Patient Active Problem List   Diagnosis Date Noted  . Low hemoglobin 02/05/2019  . Hypersomnolence 02/04/2019  . OSA on CPAP 02/04/2019  . Chronic bilateral low back pain without sciatica 02/04/2019  . Mild obstructive sleep apnea 07/29/2018  . Pulmonary nodules 07/29/2018  . Allergic rhinitis with postnasal drip 04/08/2018  . Snoring 01/02/2018  . Encounter for  medication management in attention deficit hyperactivity disorder (ADHD) 07/24/2017  . Mixed hyperlipidemia 04/16/2017  . Adult ADHD 04/13/2017  . Class 1 obesity due to excess calories with serious comorbidity and body mass index (BMI) of 30.0 to 30.9 in adult 04/13/2017  . Hypertension goal BP (blood pressure) < 130/80 04/13/2017  . Wears contact lenses 04/13/2017  . Electronic cigarette use 04/13/2017    Family History  Problem Relation Age of Onset  . Depression Mother   . Hypercholesterolemia Father   . Depression Sister   . Stroke Paternal Grandfather   . Cancer Paternal Grandfather   . Heart attack Paternal Grandfather     Social History   Tobacco Use  Smoking Status Former Smoker  . Years: 2.00  . Types: Cigarettes  . Quit date: 04/13/2012  . Years since quitting: 8.5  Smokeless Tobacco Never Used    Past Surgical History:  Procedure Laterality Date  . HAND SURGERY Right   . TONSILLECTOMY  1997    Immunization History  Administered Date(s) Administered  . Influenza,trivalent, recombinat, inj, PF 05/28/2016  . Influenza-Unspecified 05/19/2019  . PFIZER(Purple Top)SARS-COV-2 Vaccination 08/25/2018, 09/21/2018    No results found for this or any previous visit (from the past 2160 hour(s)).  No results found.     All questions at time of visit were answered - patient instructed to contact office with any additional concerns or updates. ER/RTC precautions were reviewed with the patient as applicable.   Please note: manual typing as well as voice recognition software may have been used to produce this document - typos may escape review. Please contact Dr. Lyn Hollingshead for any needed clarifications.

## 2020-11-12 DIAGNOSIS — G4733 Obstructive sleep apnea (adult) (pediatric): Secondary | ICD-10-CM | POA: Diagnosis not present

## 2020-11-26 ENCOUNTER — Other Ambulatory Visit (HOSPITAL_COMMUNITY): Payer: Self-pay

## 2020-12-07 ENCOUNTER — Other Ambulatory Visit (HOSPITAL_COMMUNITY): Payer: Self-pay

## 2020-12-07 MED FILL — Atorvastatin Calcium Tab 20 MG (Base Equivalent): ORAL | 90 days supply | Qty: 90 | Fill #0 | Status: AC

## 2020-12-07 MED FILL — Lisinopril Tab 20 MG: ORAL | 90 days supply | Qty: 90 | Fill #0 | Status: CN

## 2020-12-07 MED FILL — Montelukast Sodium Tab 10 MG (Base Equiv): ORAL | 90 days supply | Qty: 90 | Fill #0 | Status: CN

## 2020-12-08 ENCOUNTER — Other Ambulatory Visit (HOSPITAL_COMMUNITY): Payer: Self-pay

## 2020-12-31 ENCOUNTER — Other Ambulatory Visit (HOSPITAL_COMMUNITY): Payer: Self-pay

## 2020-12-31 MED FILL — Lisinopril Tab 20 MG: ORAL | 90 days supply | Qty: 90 | Fill #0 | Status: AC

## 2021-01-05 ENCOUNTER — Encounter: Payer: Self-pay | Admitting: Osteopathic Medicine

## 2021-01-30 MED FILL — Montelukast Sodium Tab 10 MG (Base Equiv): ORAL | 90 days supply | Qty: 90 | Fill #0 | Status: AC

## 2021-01-31 ENCOUNTER — Other Ambulatory Visit: Payer: Self-pay | Admitting: Osteopathic Medicine

## 2021-01-31 ENCOUNTER — Other Ambulatory Visit (HOSPITAL_COMMUNITY): Payer: Self-pay

## 2021-01-31 DIAGNOSIS — G8929 Other chronic pain: Secondary | ICD-10-CM

## 2021-01-31 MED ORDER — TIZANIDINE HCL 4 MG PO TABS
ORAL_TABLET | ORAL | 0 refills | Status: DC
  Filled 2021-01-31: qty 90, 90d supply, fill #0

## 2021-01-31 NOTE — Telephone Encounter (Signed)
[facility] pharmacy requesting med refill for tizanidine. Rx not listed in active med list.

## 2021-02-04 DIAGNOSIS — G4733 Obstructive sleep apnea (adult) (pediatric): Secondary | ICD-10-CM | POA: Diagnosis not present

## 2021-02-18 ENCOUNTER — Other Ambulatory Visit (HOSPITAL_COMMUNITY): Payer: Self-pay

## 2021-02-24 ENCOUNTER — Other Ambulatory Visit (HOSPITAL_COMMUNITY): Payer: Self-pay

## 2021-02-24 MED FILL — Amphetamine-Dextroamphetamine Tab 20 MG: ORAL | 90 days supply | Qty: 180 | Fill #0 | Status: AC

## 2021-02-25 ENCOUNTER — Other Ambulatory Visit (HOSPITAL_COMMUNITY): Payer: Self-pay

## 2021-03-04 ENCOUNTER — Other Ambulatory Visit (HOSPITAL_COMMUNITY): Payer: Self-pay

## 2021-03-04 MED FILL — Atorvastatin Calcium Tab 20 MG (Base Equivalent): ORAL | 90 days supply | Qty: 90 | Fill #1 | Status: AC

## 2021-04-01 ENCOUNTER — Other Ambulatory Visit (HOSPITAL_COMMUNITY): Payer: Self-pay

## 2021-04-01 MED FILL — Lisinopril Tab 20 MG: ORAL | 90 days supply | Qty: 90 | Fill #1 | Status: AC

## 2021-04-28 ENCOUNTER — Other Ambulatory Visit (HOSPITAL_COMMUNITY): Payer: Self-pay

## 2021-04-28 ENCOUNTER — Other Ambulatory Visit: Payer: Self-pay | Admitting: Osteopathic Medicine

## 2021-04-28 DIAGNOSIS — J309 Allergic rhinitis, unspecified: Secondary | ICD-10-CM

## 2021-04-28 DIAGNOSIS — G8929 Other chronic pain: Secondary | ICD-10-CM

## 2021-04-28 DIAGNOSIS — M545 Low back pain, unspecified: Secondary | ICD-10-CM

## 2021-04-28 MED ORDER — TIZANIDINE HCL 4 MG PO TABS
ORAL_TABLET | ORAL | 0 refills | Status: DC
  Filled 2021-04-28: qty 30, 30d supply, fill #0

## 2021-04-28 MED ORDER — MONTELUKAST SODIUM 10 MG PO TABS
ORAL_TABLET | Freq: Every day | ORAL | 0 refills | Status: DC
  Filled 2021-04-28: qty 30, 30d supply, fill #0

## 2021-04-29 DIAGNOSIS — G4733 Obstructive sleep apnea (adult) (pediatric): Secondary | ICD-10-CM | POA: Diagnosis not present

## 2021-05-02 ENCOUNTER — Encounter: Payer: Self-pay | Admitting: Osteopathic Medicine

## 2021-05-02 ENCOUNTER — Ambulatory Visit (INDEPENDENT_AMBULATORY_CARE_PROVIDER_SITE_OTHER): Payer: 59 | Admitting: Osteopathic Medicine

## 2021-05-02 ENCOUNTER — Other Ambulatory Visit (HOSPITAL_COMMUNITY): Payer: Self-pay

## 2021-05-02 ENCOUNTER — Other Ambulatory Visit: Payer: Self-pay

## 2021-05-02 VITALS — BP 116/68 | HR 99 | Temp 97.5°F | Ht 68.0 in | Wt 210.1 lb

## 2021-05-02 DIAGNOSIS — R0982 Postnasal drip: Secondary | ICD-10-CM

## 2021-05-02 DIAGNOSIS — Z23 Encounter for immunization: Secondary | ICD-10-CM | POA: Diagnosis not present

## 2021-05-02 DIAGNOSIS — G8929 Other chronic pain: Secondary | ICD-10-CM | POA: Diagnosis not present

## 2021-05-02 DIAGNOSIS — E782 Mixed hyperlipidemia: Secondary | ICD-10-CM

## 2021-05-02 DIAGNOSIS — I10 Essential (primary) hypertension: Secondary | ICD-10-CM

## 2021-05-02 DIAGNOSIS — Z0000 Encounter for general adult medical examination without abnormal findings: Secondary | ICD-10-CM

## 2021-05-02 DIAGNOSIS — J309 Allergic rhinitis, unspecified: Secondary | ICD-10-CM

## 2021-05-02 DIAGNOSIS — F909 Attention-deficit hyperactivity disorder, unspecified type: Secondary | ICD-10-CM

## 2021-05-02 DIAGNOSIS — M545 Low back pain, unspecified: Secondary | ICD-10-CM

## 2021-05-02 MED ORDER — ATORVASTATIN CALCIUM 20 MG PO TABS
20.0000 mg | ORAL_TABLET | Freq: Every day | ORAL | 3 refills | Status: DC
  Filled 2021-05-02 – 2021-05-31 (×2): qty 90, 90d supply, fill #0
  Filled 2021-08-25: qty 90, 90d supply, fill #1
  Filled 2021-11-24: qty 90, 90d supply, fill #2
  Filled 2022-02-24: qty 90, 90d supply, fill #3

## 2021-05-02 MED ORDER — LISINOPRIL 20 MG PO TABS
ORAL_TABLET | Freq: Every day | ORAL | 3 refills | Status: DC
Start: 2021-05-02 — End: 2022-06-22
  Filled 2021-05-02: qty 90, fill #0
  Filled 2021-06-30: qty 90, 90d supply, fill #0
  Filled 2021-09-27: qty 90, 90d supply, fill #1
  Filled 2021-12-26: qty 90, 90d supply, fill #2
  Filled 2022-03-24: qty 90, 90d supply, fill #3

## 2021-05-02 MED ORDER — AMPHETAMINE-DEXTROAMPHETAMINE 20 MG PO TABS
20.0000 mg | ORAL_TABLET | Freq: Two times a day (BID) | ORAL | 0 refills | Status: DC
  Filled 2021-08-25 – 2021-08-27 (×3): qty 180, 90d supply, fill #0

## 2021-05-02 MED ORDER — TIZANIDINE HCL 4 MG PO TABS
ORAL_TABLET | ORAL | 3 refills | Status: DC
  Filled 2021-05-02: qty 30, fill #0
  Filled 2021-05-31: qty 90, 90d supply, fill #0
  Filled 2021-08-25: qty 30, 30d supply, fill #1

## 2021-05-02 MED ORDER — FLUTICASONE PROPIONATE 50 MCG/ACT NA SUSP
1.0000 | Freq: Every day | NASAL | 3 refills | Status: DC
  Filled 2021-05-02: qty 16, 60d supply, fill #0
  Filled 2021-07-06: qty 16, 60d supply, fill #1
  Filled 2021-08-25: qty 16, 60d supply, fill #2
  Filled 2021-11-09: qty 16, 30d supply, fill #3
  Filled 2021-12-26: qty 16, 60d supply, fill #4
  Filled 2022-02-24: qty 16, 60d supply, fill #5

## 2021-05-02 MED ORDER — AMPHETAMINE-DEXTROAMPHETAMINE 20 MG PO TABS
20.0000 mg | ORAL_TABLET | Freq: Two times a day (BID) | ORAL | 0 refills | Status: DC
  Filled 2021-05-02 – 2021-05-31 (×2): qty 180, 90d supply, fill #0

## 2021-05-02 MED ORDER — MONTELUKAST SODIUM 10 MG PO TABS
10.0000 mg | ORAL_TABLET | Freq: Every day | ORAL | 3 refills | Status: DC
  Filled 2021-05-02 – 2021-05-31 (×2): qty 90, 90d supply, fill #0
  Filled 2021-08-25: qty 90, 90d supply, fill #1
  Filled 2021-11-24: qty 90, 90d supply, fill #2
  Filled 2022-02-24: qty 90, 90d supply, fill #3

## 2021-05-02 NOTE — Progress Notes (Signed)
Stephen Steele is a 30 y.o. male who presents to  [facility] at [facility]  today, 05/02/21, seeking care for the following:  Annual physical     ASSESSMENT & PLAN with other pertinent findings:  The primary encounter diagnosis was Annual physical exam. Diagnoses of Need for influenza vaccination, Allergic rhinitis with postnasal drip, Hypertension goal BP (blood pressure) < 130/80, Mixed hyperlipidemia, Chronic bilateral low back pain without sciatica, and Adult ADHD were also pertinent to this visit.   Preventive care reviewed as noted below 6 months ADHD medications sent, patient has been compliant with meds, blood pressure has been good, medications are working well.    Patient Instructions  General Preventive Care Most recent routine screening labs: ordered today.  Blood pressure goal 130/80 or less.  Tobacco: don't! Alcohol: responsible moderation is ok for most adults - if you have concerns about your alcohol intake, please talk to me!  Exercise: as tolerated to reduce risk of cardiovascular disease and diabetes. Strength training will also prevent osteoporosis.  Mental health: if need for mental health care (medicines, counseling, other), or concerns about moods, please let me know!  Sexual / Reproductive health: if need for STD testing, or if concerns with libido/pain problems, please let me know! If need to discuss family planning, let me know!  Advanced Directive: Living Will and/or Healthcare Power of Attorney recommended for all adults, regardless of age or health.  Vaccines Flu vaccine: every fall.  Shingles vaccine: after age 23 Pneumonia vaccines: after age 43 Tetanus booster: every 10 years  HPV vaccine: Gardasil up to age 29  COVID vaccine: THANKS for getting your vaccine! :) BOOSTER(S) RECOMMENDED Cancer screenings  Colon cancer screening: for everyone age 70-75.   Prostate cancer screening: PSA blood test age  40-71 Lung cancer screening: not needed for non-smokers  Infection screenings  HIV: recommended screening at least once age 75-65 Gonorrhea/Chlamydia: screening as needed Hepatitis C: recommended once for everyone age 14-75 TB: certain at-risk populations, or depending on work requirements and/or travel history Other Abdominal Aortic Aneurysm: screening with ultrasound recommended once for men age 56-75 who have ever smoked   Orders Placed This Encounter  Procedures   Flu Vaccine QUAD 6+ mos PF IM (Fluarix Quad PF)   COMPLETE METABOLIC PANEL WITH GFR   Lipid panel   CBC    Meds ordered this encounter  Medications   amphetamine-dextroamphetamine (ADDERALL) 20 MG tablet    Sig: Take 1 tablet (20 mg total) by mouth 2 (two) times daily.    Dispense:  180 tablet    Refill:  0   montelukast (SINGULAIR) 10 MG tablet    Sig: Take 1 tablet (10 mg total) by mouth daily.    Dispense:  90 tablet    Refill:  3   lisinopril (ZESTRIL) 20 MG tablet    Sig: TAKE 1 TABLET BY MOUTH ONCE A DAY    Dispense:  90 tablet    Refill:  3   atorvastatin (LIPITOR) 20 MG tablet    Sig: Take 1 tablet (20 mg total) by mouth daily.    Dispense:  90 tablet    Refill:  3   fluticasone (FLONASE) 50 MCG/ACT nasal spray    Sig: Place 1 spray into both nostrils daily.    Dispense:  48 g    Refill:  3   tiZANidine (ZANAFLEX) 4 MG tablet    Sig: TAKE 1 TABLET BY MOUTH AT BEDTIME AS NEEDED  FOR MUSCLE SPASMS.    Dispense:  30 tablet    Refill:  3   amphetamine-dextroamphetamine (ADDERALL) 20 MG tablet    Sig: Take 1 tablet (20 mg total) by mouth 2 (two) times daily.    Dispense:  180 tablet    Refill:  0     See below for relevant physical exam findings  See below for recent lab and imaging results reviewed  Medications, allergies, PMH, PSH, SocH, FamH reviewed below    Follow-up instructions: Return in about 6 months (around 10/30/2021) for MAINTAIN ADHD RX, SEE [place] SOONER IF NEEDED. CALL/MESSAGE W/  QUESTIONS.                                        Exam:  BP 116/68 (BP Location: Left Arm, Patient Position: Sitting, Cuff Size: Large)   Pulse 99   Temp (!) 97.5 F (36.4 C)   Ht 5\' 8"  (1.727 m)   Wt 210 lb 1.6 oz (95.3 kg)   SpO2 100%   BMI 31.95 kg/m  Constitutional: VS see above. General Appearance: alert, well-developed, well-nourished, NAD Neck: No masses, trachea midline.  Respiratory: Normal respiratory effort. no wheeze, no rhonchi, no rales Cardiovascular: S1/S2 normal, no murmur, no rub/gallop auscultated. RRR.  Musculoskeletal: Gait normal. Symmetric and independent movement of all extremities Abdominal: non-tender, non-distended, no appreciable organomegaly, neg Murphy's, BS [id number] Neurological: Normal balance/coordination. No tremor. Skin: warm, dry, intact.  Psychiatric: Normal judgment/insight. Normal mood and affect. Oriented x3.   Current Meds  Medication Sig   AMBULATORY NON FORMULARY MEDICATION Supply ordered: CPAP and other supplies needed (headgear, cushions, filters, heated tuubing and water chamber) Dx: obstructive sleep apnea Settings: auto-titration 5-20 cmH2O   [DISCONTINUED] amphetamine-dextroamphetamine (ADDERALL) 20 MG tablet TAKE 1 TABLET BY MOUTH 2 TIMES DAILY - EFFECTIVE 01/24/21   [DISCONTINUED] atorvastatin (LIPITOR) 20 MG tablet TAKE 1 TABLET BY MOUTH AT BEDTIME   [DISCONTINUED] fluticasone (FLONASE) 50 MCG/ACT nasal spray Place 1 spray into both nostrils daily.   [DISCONTINUED] lisinopril (ZESTRIL) 20 MG tablet TAKE 1 TABLET BY MOUTH ONCE A DAY   [DISCONTINUED] montelukast (SINGULAIR) 10 MG tablet TAKE 1 TABLET BY MOUTH ONCE DAILY AT BEDTIME   [DISCONTINUED] tiZANidine (ZANAFLEX) 4 MG tablet TAKE 1 TABLET BY MOUTH AT BEDTIME AS NEEDED FOR MUSCLE SPASMS.    No Known Allergies  Patient Active Problem List   Diagnosis Date Noted   Low hemoglobin 02/05/2019   Hypersomnolence 02/04/2019   OSA on CPAP  02/04/2019   Chronic bilateral low back pain without sciatica 02/04/2019   Mild obstructive sleep apnea 07/29/2018   Pulmonary nodules 07/29/2018   Allergic rhinitis with postnasal drip 04/08/2018   Snoring 01/02/2018   Encounter for medication management in attention deficit hyperactivity disorder (ADHD) 07/24/2017   Mixed hyperlipidemia 04/16/2017   Adult ADHD 04/13/2017   Class 1 obesity due to excess calories with serious comorbidity and body mass index (BMI) of 30.0 to 30.9 in adult 04/13/2017   Hypertension goal BP (blood pressure) < 130/80 04/13/2017   Wears contact lenses 04/13/2017   Electronic cigarette use 04/13/2017    Family History  Problem Relation Age of Onset   Depression Mother    Hypercholesterolemia Father    Depression Sister    Stroke Paternal Grandfather    Cancer Paternal Grandfather    Heart attack Paternal Grandfather     Social History   Tobacco  Use  Smoking Status Former   Years: 2.00   Types: Cigarettes   Quit date: 04/13/2012   Years since quitting: 9.0  Smokeless Tobacco Never    Past Surgical History:  Procedure Laterality Date   HAND SURGERY Right    TONSILLECTOMY  1997    Immunization History  Administered Date(s) Administered   Influenza,inj,Quad PF,6+ Mos 05/02/2021   Influenza,trivalent, recombinat, inj, PF 05/28/2016   Influenza-Unspecified 05/19/2019   PFIZER(Purple Top)SARS-COV-2 Vaccination 08/25/2018, 09/21/2018    No results found for this or any previous visit (from the past 2160 hour(s)).  No results found.     All questions at time of visit were answered - patient instructed to contact office with any additional concerns or updates. ER/RTC precautions were reviewed with the patient as applicable.   Please note: manual typing as well as voice recognition software may have been used to produce this document - typos may escape review. Please contact Dr. Lyn Hollingshead for any needed clarifications.

## 2021-05-02 NOTE — Patient Instructions (Signed)
General Preventive Care Most recent routine screening labs: ordered today.  Blood pressure goal 130/80 or less.  Tobacco: don't! Alcohol: responsible moderation is ok for most adults - if you have concerns about your alcohol intake, please talk to me!  Exercise: as tolerated to reduce risk of cardiovascular disease and diabetes. Strength training will also prevent osteoporosis.  Mental health: if need for mental health care (medicines, counseling, other), or concerns about moods, please let me know!  Sexual / Reproductive health: if need for STD testing, or if concerns with libido/pain problems, please let me know! If need to discuss family planning, let me know!  Advanced Directive: Living Will and/or Healthcare Power of Attorney recommended for all adults, regardless of age or health.  Vaccines Flu vaccine: every fall.  Shingles vaccine: after age 96 Pneumonia vaccines: after age 23 Tetanus booster: every 10 years  HPV vaccine: Gardasil up to age 66  COVID vaccine: THANKS for getting your vaccine! :) BOOSTER(S) RECOMMENDED Cancer screenings  Colon cancer screening: for everyone age 36-75.   Prostate cancer screening: PSA blood test age 21-71 Lung cancer screening: not needed for non-smokers  Infection screenings  HIV: recommended screening at least once age 36-65 Gonorrhea/Chlamydia: screening as needed Hepatitis C: recommended once for everyone age 67-75 TB: certain at-risk populations, or depending on work requirements and/or travel history Other Abdominal Aortic Aneurysm: screening with ultrasound recommended once for men age 15-75 who have ever smoked

## 2021-05-03 ENCOUNTER — Encounter: Payer: Self-pay | Admitting: Osteopathic Medicine

## 2021-05-03 LAB — CBC
HCT: 44 % (ref 38.5–50.0)
Hemoglobin: 14.6 g/dL (ref 13.2–17.1)
MCH: 30.4 pg (ref 27.0–33.0)
MCHC: 33.2 g/dL (ref 32.0–36.0)
MCV: 91.7 fL (ref 80.0–100.0)
MPV: 9.9 fL (ref 7.5–12.5)
Platelets: 288 10*3/uL (ref 140–400)
RBC: 4.8 10*6/uL (ref 4.20–5.80)
RDW: 12.4 % (ref 11.0–15.0)
WBC: 5.6 10*3/uL (ref 3.8–10.8)

## 2021-05-03 LAB — COMPLETE METABOLIC PANEL WITH GFR
AG Ratio: 2.2 (calc) (ref 1.0–2.5)
ALT: 40 U/L (ref 9–46)
AST: 20 U/L (ref 10–40)
Albumin: 5.1 g/dL (ref 3.6–5.1)
Alkaline phosphatase (APISO): 60 U/L (ref 36–130)
BUN: 16 mg/dL (ref 7–25)
CO2: 29 mmol/L (ref 20–32)
Calcium: 10.2 mg/dL (ref 8.6–10.3)
Chloride: 99 mmol/L (ref 98–110)
Creat: 1.17 mg/dL (ref 0.60–1.24)
Globulin: 2.3 g/dL (calc) (ref 1.9–3.7)
Glucose, Bld: 97 mg/dL (ref 65–99)
Potassium: 4.8 mmol/L (ref 3.5–5.3)
Sodium: 136 mmol/L (ref 135–146)
Total Bilirubin: 0.5 mg/dL (ref 0.2–1.2)
Total Protein: 7.4 g/dL (ref 6.1–8.1)
eGFR: 87 mL/min/{1.73_m2} (ref >=60)

## 2021-05-03 LAB — LIPID PANEL
Cholesterol: 153 mg/dL (ref <200)
HDL: 36 mg/dL — ABNORMAL LOW (ref >=40)
LDL Cholesterol (Calc): 102 mg/dL (calc) — ABNORMAL HIGH
Non-HDL Cholesterol (Calc): 117 mg/dL (calc) (ref <130)
Total CHOL/HDL Ratio: 4.3 (calc) (ref <5.0)
Triglycerides: 68 mg/dL (ref <150)

## 2021-05-04 ENCOUNTER — Other Ambulatory Visit (HOSPITAL_COMMUNITY): Payer: Self-pay

## 2021-05-04 MED ORDER — PSEUDOEPHEDRINE HCL ER 120 MG PO TB12
120.0000 mg | ORAL_TABLET | Freq: Two times a day (BID) | ORAL | 3 refills | Status: DC | PRN
  Filled 2021-05-04 (×2): qty 180, 90d supply, fill #0
  Filled 2021-08-25 (×2): qty 180, 90d supply, fill #1
  Filled 2021-12-26 – 2021-12-27 (×2): qty 180, 90d supply, fill #2

## 2021-05-05 ENCOUNTER — Other Ambulatory Visit (HOSPITAL_COMMUNITY): Payer: Self-pay

## 2021-05-31 ENCOUNTER — Other Ambulatory Visit (HOSPITAL_COMMUNITY): Payer: Self-pay

## 2021-06-01 ENCOUNTER — Other Ambulatory Visit (HOSPITAL_COMMUNITY): Payer: Self-pay

## 2021-06-30 ENCOUNTER — Other Ambulatory Visit (HOSPITAL_COMMUNITY): Payer: Self-pay

## 2021-07-06 ENCOUNTER — Other Ambulatory Visit (HOSPITAL_COMMUNITY): Payer: Self-pay

## 2021-07-22 DIAGNOSIS — G4733 Obstructive sleep apnea (adult) (pediatric): Secondary | ICD-10-CM | POA: Diagnosis not present

## 2021-08-25 ENCOUNTER — Other Ambulatory Visit (HOSPITAL_COMMUNITY): Payer: Self-pay

## 2021-08-26 ENCOUNTER — Other Ambulatory Visit (HOSPITAL_COMMUNITY): Payer: Self-pay

## 2021-08-27 ENCOUNTER — Other Ambulatory Visit (HOSPITAL_COMMUNITY): Payer: Self-pay

## 2021-09-27 ENCOUNTER — Other Ambulatory Visit: Payer: Self-pay | Admitting: Osteopathic Medicine

## 2021-09-27 ENCOUNTER — Other Ambulatory Visit (HOSPITAL_COMMUNITY): Payer: Self-pay

## 2021-09-27 MED ORDER — TIZANIDINE HCL 4 MG PO TABS
ORAL_TABLET | ORAL | 0 refills | Status: DC
  Filled 2021-09-27 – 2021-10-12 (×2): qty 30, 30d supply, fill #0

## 2021-10-03 ENCOUNTER — Other Ambulatory Visit: Payer: Self-pay

## 2021-10-03 ENCOUNTER — Emergency Department
Admission: EM | Admit: 2021-10-03 | Discharge: 2021-10-03 | Disposition: A | Discharge status: Home / Self Care | Payer: 59 | Source: Home / Self Care | Attending: Family Medicine | Admitting: Family Medicine

## 2021-10-03 DIAGNOSIS — H6692 Otitis media, unspecified, left ear: Secondary | ICD-10-CM

## 2021-10-03 DIAGNOSIS — B349 Viral infection, unspecified: Secondary | ICD-10-CM

## 2021-10-03 LAB — POCT MONO SCREEN (KUC): Mono, POC: NEGATIVE

## 2021-10-03 MED ORDER — AMOXICILLIN 875 MG PO TABS: 875.0000 mg | ORAL_TABLET | Freq: Two times a day (BID) | ORAL | 0 refills | Status: DC

## 2021-10-03 NOTE — Discharge Instructions (Signed)
Continue to drink lots of fluids May take over-the-counter cough and cold medicines Take the amoxicillin 3 times a day for until gone

## 2021-10-03 NOTE — ED Provider Notes (Signed)
[facility]    CSN: [id number] Arrival date & time: 10/03/21  0909      History   Chief Complaint Chief Complaint  Patient presents with   Sore Throat   Fever   Headache    HPI Damier Disano is a 31 y.o. male.   HPI Patient has had upper respiratory symptoms for 6 days.  He feels like he is getting worse.  He has postnasal drip and fever, headaches photophobia, neck pain and stiffness.  He is swollen glands.  He worries he might have mono.  He did a home COVID test that was negative.  He has not been exposed to influenza.  He has a 29-month-old daughter in daycare who has been bringing home with variety of viruses.  Past Medical History:  Diagnosis Date   Acute bilateral low back pain without sciatica 07/24/2017   Adult ADHD 04/13/2017   Allergy    Chronic left shoulder pain    Hypertension 04/13/2017   Mixed hyperlipidemia 04/16/2017   Obesity    Pleural effusion on right 10/25/2017   Tachycardia with heart rate 100-120 beats per minute 01/02/2018    Patient Active Problem List   Diagnosis Date Noted   Low hemoglobin 02/05/2019   Hypersomnolence 02/04/2019   OSA on CPAP 02/04/2019   Chronic bilateral low back pain without sciatica 02/04/2019   Mild obstructive sleep apnea 07/29/2018   Pulmonary nodules 07/29/2018   Allergic rhinitis with postnasal drip 04/08/2018   Snoring 01/02/2018   Encounter for medication management in attention deficit hyperactivity disorder (ADHD) 07/24/2017   Mixed hyperlipidemia 04/16/2017   Adult ADHD 04/13/2017   Class 1 obesity due to excess calories with serious comorbidity and body mass index (BMI) of 30.0 to 30.9 in adult 04/13/2017   Hypertension goal BP (blood pressure) < 130/80 04/13/2017   Wears contact lenses 04/13/2017   Electronic cigarette use 04/13/2017    Past Surgical History:  Procedure Laterality Date   HAND SURGERY Right    TONSILLECTOMY  1997       Home Medications    Prior to Admission medications    Medication Sig Start Date End Date Taking? Authorizing Provider  amoxicillin (AMOXIL) 875 MG tablet Take 1 tablet (875 mg total) by mouth 2 (two) times daily. 10/03/21  Yes Eustace Moore, MD  AMBULATORY NON FORMULARY MEDICATION Supply ordered: CPAP and other supplies needed (headgear, cushions, filters, heated tuubing and water chamber) Dx: obstructive sleep apnea Settings: auto-titration 5-20 cmH2O 10/26/20   Sunnie Nielsen, DO  amphetamine-dextroamphetamine (ADDERALL) 20 MG tablet Take 1 tablet (20 mg total) by mouth 2 (two) times daily. 07/31/21 11/29/21  Sunnie Nielsen, DO  atorvastatin (LIPITOR) 20 MG tablet Take 1 tablet (20 mg total) by mouth daily. 05/02/21   Sunnie Nielsen, DO  fluticasone [facility]) 50 MCG/ACT nasal spray Place 1 spray into both nostrils daily. 05/02/21   Sunnie Nielsen, DO  lisinopril (ZESTRIL) 20 MG tablet TAKE 1 TABLET BY MOUTH ONCE A DAY 05/02/21 05/02/22  Sunnie Nielsen, DO  montelukast (SINGULAIR) 10 MG tablet Take 1 tablet (10 mg total) by mouth daily. 05/02/21   Sunnie Nielsen, DO  pseudoephedrine (SUDAFED) 120 MG 12 hr tablet Take 1 tablet (120 mg total) by mouth every 12 (twelve) hours as needed for congestion. 05/04/21   Sunnie Nielsen, DO  tiZANidine (ZANAFLEX) 4 MG tablet Take 1 tablet by mouth at bedtime as needed for muscle spasms. NO REFILLS. NEEDS TO TRANSFER CARE TO NEW PCP. 09/27/21   Ashley Royalty,  Selena Batten, DO    Family History Family History  Problem Relation Age of Onset   Depression Mother    Hypercholesterolemia Father    Depression Sister    Stroke Paternal Grandfather    Cancer Paternal Grandfather    Heart attack Paternal Grandfather     Social History Social History   Tobacco Use   Smoking status: Former    Years: 2.00    Types: Cigarettes    Quit date: 04/13/2012    Years since quitting: 9.4   Smokeless tobacco: Never  Vaping Use   Vaping Use: Some days  Substance Use Topics   Alcohol use: Yes     Alcohol/week: 7.0 standard drinks    Types: 7 Glasses of wine per week   Drug use: Yes     Allergies   Patient has no known allergies.   Review of Systems Review of Systems  See HPI Physical Exam Triage Vital Signs ED Triage Vitals  Enc Vitals Group     BP 10/03/21 0920 127/82     Pulse Rate 10/03/21 0920 (!) 103     Resp 10/03/21 0920 14     Temp 10/03/21 0920 98.9 F (37.2 C)     Temp Source 10/03/21 0920 Oral     SpO2 10/03/21 0920 100 %     Weight 10/03/21 0922 210 lb (95.3 kg)     Height --      Head Circumference --      Peak Flow --      Pain Score 10/03/21 0922 4     Pain Loc --      Pain Edu? --      Excl. in GC? --    No data found.  Updated Vital Signs BP 127/82 (BP Location: Right Arm)    Pulse (!) 103    Temp 98.9 F (37.2 C) (Oral)    Resp 14    Wt 95.3 kg    SpO2 100%    BMI 31.93 kg/m      Physical Exam Constitutional:      General: He is not in acute distress.    Appearance: He is well-developed.  HENT:     Head: Normocephalic and atraumatic.     Right Ear: Tympanic membrane and ear canal normal.     Left Ear: Tympanic membrane is erythematous.     Nose: Congestion and rhinorrhea present.     Mouth/Throat:     Mouth: Mucous membranes are moist.     Pharynx: Uvula midline. Posterior oropharyngeal erythema present.     Tonsils: No tonsillar exudate.  Eyes:     Conjunctiva/sclera: Conjunctivae normal.     Pupils: Pupils are equal, round, and reactive to light.  Cardiovascular:     Rate and Rhythm: Normal rate and regular rhythm.  Pulmonary:     Effort: Pulmonary effort is normal. No respiratory distress.     Breath sounds: Normal breath sounds.  Abdominal:     General: There is no distension.     Palpations: Abdomen is soft.  Musculoskeletal:        General: Normal range of motion.     Cervical back: Normal range of motion.  Lymphadenopathy:     Cervical: Cervical adenopathy present.  Skin:    General: Skin is warm and dry.   Neurological:     Mental Status: He is alert.     UC Treatments / Results  Labs (all labs ordered are listed, but only abnormal results are  displayed) Labs Reviewed  POCT MONO SCREEN [facility])    EKG   Radiology No results found.  Procedures Procedures (including critical care time)  Medications Ordered in UC Medications - No data to display  Initial Impression / Assessment and Plan / UC Course  I have reviewed the triage vital signs and the nursing notes.  Pertinent labs & imaging results that were available during my care of the patient were reviewed by me and considered in my medical decision making (see chart for details).    We will treat with antibiotics for the erythematous ear and worsening symptoms Monotest is negative Final Clinical Impressions(s) / UC Diagnoses   Final diagnoses:  Viral illness  Acute left otitis media     Discharge Instructions      Continue to drink lots of fluids May take over-the-counter cough and cold medicines Take the amoxicillin 3 times a day for until gone   ED Prescriptions     Medication Sig Dispense Auth. Provider   amoxicillin (AMOXIL) 875 MG tablet Take 1 tablet (875 mg total) by mouth 2 (two) times daily. 14 tablet Eustace Moore, MD      PDMP not reviewed this encounter.   Eustace Moore, MD 10/03/21 1233

## 2021-10-03 NOTE — ED Triage Notes (Signed)
Pt presents with post nasal drip, fever, and sore throat that began Wednesday. Home covid test negative Saturday.

## 2021-10-06 ENCOUNTER — Other Ambulatory Visit (HOSPITAL_COMMUNITY): Payer: Self-pay

## 2021-10-12 ENCOUNTER — Other Ambulatory Visit (HOSPITAL_COMMUNITY): Payer: Self-pay

## 2021-10-20 DIAGNOSIS — G4733 Obstructive sleep apnea (adult) (pediatric): Secondary | ICD-10-CM | POA: Diagnosis not present

## 2021-11-09 ENCOUNTER — Other Ambulatory Visit (HOSPITAL_COMMUNITY): Payer: Self-pay

## 2021-11-14 ENCOUNTER — Ambulatory Visit: Payer: 59 | Admitting: Medical-Surgical

## 2021-11-24 ENCOUNTER — Other Ambulatory Visit (HOSPITAL_COMMUNITY): Payer: Self-pay

## 2021-12-20 ENCOUNTER — Other Ambulatory Visit (HOSPITAL_COMMUNITY): Payer: Self-pay

## 2021-12-20 ENCOUNTER — Ambulatory Visit: Payer: 59 | Admitting: Medical-Surgical

## 2021-12-20 ENCOUNTER — Encounter: Payer: Self-pay | Admitting: Medical-Surgical

## 2021-12-20 VITALS — BP 108/72 | HR 91 | Resp 20 | Ht 68.0 in | Wt 218.9 lb

## 2021-12-20 DIAGNOSIS — F909 Attention-deficit hyperactivity disorder, unspecified type: Secondary | ICD-10-CM | POA: Diagnosis not present

## 2021-12-20 DIAGNOSIS — J309 Allergic rhinitis, unspecified: Secondary | ICD-10-CM

## 2021-12-20 DIAGNOSIS — R0982 Postnasal drip: Secondary | ICD-10-CM

## 2021-12-20 DIAGNOSIS — E782 Mixed hyperlipidemia: Secondary | ICD-10-CM

## 2021-12-20 DIAGNOSIS — I10 Essential (primary) hypertension: Secondary | ICD-10-CM

## 2021-12-20 DIAGNOSIS — Z7689 Persons encountering health services in other specified circumstances: Secondary | ICD-10-CM

## 2021-12-20 MED ORDER — AMPHETAMINE-DEXTROAMPHETAMINE 20 MG PO TABS
20.0000 mg | ORAL_TABLET | Freq: Two times a day (BID) | ORAL | 0 refills | Status: DC
  Filled 2022-03-01: qty 60, 30d supply, fill #0

## 2021-12-20 MED ORDER — AMPHETAMINE-DEXTROAMPHETAMINE 20 MG PO TABS
20.0000 mg | ORAL_TABLET | Freq: Two times a day (BID) | ORAL | 0 refills | Status: DC
Start: 2021-12-20 — End: 2022-04-05
  Filled 2021-12-20: qty 60, 30d supply, fill #0

## 2021-12-20 MED ORDER — AMPHETAMINE-DEXTROAMPHETAMINE 20 MG PO TABS
20.0000 mg | ORAL_TABLET | Freq: Two times a day (BID) | ORAL | 0 refills | Status: DC
  Filled 2022-01-30: qty 60, 30d supply, fill #0

## 2021-12-20 MED ORDER — TIZANIDINE HCL 4 MG PO TABS
ORAL_TABLET | ORAL | 1 refills | Status: DC
  Filled 2021-12-20: qty 90, 90d supply, fill #0
  Filled 2022-03-24: qty 90, 90d supply, fill #1

## 2021-12-20 NOTE — Progress Notes (Signed)
?  HPI with pertinent ROS:  ? ?CC: Transfer of care ? ?HPI: ?Pleasant 31 year old male presenting to establish care with a new PCP and for the following: ? ?ADHD-taking Adderall 20 mg twice daily, tolerating well without side effects.  Feels the medication works well.  No changes in weight, appetite, or sleeping patterns. ? ?Hyperlipidemia-taking atorvastatin 20 mg daily, tolerating well without side effects.  Does like to indulge in certain dietary habits that are less than healthy but is making changes recently. ? ?Allergies-taking singular 10 mg nightly and using Flonase daily.  Also uses Sudafed every 12 hours as needed. ? ?Hypertension-taking lisinopril 20 mg daily as prescribed, tolerating well without side effects. Denies CP, SOB, palpitations, lower extremity edema, dizziness, headaches, or vision changes. ? ?I reviewed the past medical history, family history, social history, surgical history, and allergies today and no changes were needed.  Please see the problem list section below in epic for further details. ? ? ?Physical exam:  ? ?General: Well Developed, well nourished, and in no acute distress.  ?Neuro: Alert and oriented x3.  ?HEENT: Normocephalic, atraumatic.  ?Skin: Warm and dry. ?Cardiac: Regular rate and rhythm, no murmurs rubs or gallops, no lower extremity edema.  ?Respiratory: Clear to auscultation bilaterally. Not using accessory muscles, speaking in full sentences. ? ?Impression and Recommendations:   ? ?1. Encounter to establish care ?Reviewed available information and discussed care concerns with patient.  ? ?2. Hypertension goal BP (blood pressure) < 130/80 ?Blood pressure at goal today.  Continue lisinopril 20 mg daily as prescribed.  Continue low-sodium diet.  Recommend checking blood pressure at home with a goal of 130/80 or less.  Recommend intentional exercise with a goal for weight loss to healthy weight. ? ?3. Adult ADHD ?Continue Adderall 20 mg twice daily as prescribed.  Refills  for 35-month supply sent to pharmacy. ? ?4. Mixed hyperlipidemia ?Labs checked 04/2021 will be due again in September.  Continue Lipitor as prescribed. ? ?5. Allergic rhinitis with postnasal drip ?Continue Singulair and Flonase as prescribed.  Sparing use of Sudafed as it may affect blood pressure control if used regularly. ? ?Return in about 6 months (around 06/22/2022) for annual physical exam. ?___________________________________________ ?Thayer Ohm, DNP, APRN, FNP-BC ?Primary Care and Sports Medicine ?[facility] ?

## 2021-12-21 ENCOUNTER — Other Ambulatory Visit (HOSPITAL_COMMUNITY): Payer: Self-pay

## 2021-12-26 ENCOUNTER — Other Ambulatory Visit (HOSPITAL_COMMUNITY): Payer: Self-pay

## 2021-12-27 ENCOUNTER — Other Ambulatory Visit (HOSPITAL_COMMUNITY): Payer: Self-pay

## 2021-12-28 ENCOUNTER — Other Ambulatory Visit (HOSPITAL_COMMUNITY): Payer: Self-pay

## 2022-01-12 DIAGNOSIS — G4733 Obstructive sleep apnea (adult) (pediatric): Secondary | ICD-10-CM | POA: Diagnosis not present

## 2022-01-30 ENCOUNTER — Other Ambulatory Visit (HOSPITAL_COMMUNITY): Payer: Self-pay

## 2022-02-24 ENCOUNTER — Other Ambulatory Visit (HOSPITAL_COMMUNITY): Payer: Self-pay

## 2022-02-27 ENCOUNTER — Encounter: Payer: Self-pay | Admitting: Medical-Surgical

## 2022-03-01 ENCOUNTER — Other Ambulatory Visit (HOSPITAL_COMMUNITY): Payer: Self-pay

## 2022-03-24 ENCOUNTER — Other Ambulatory Visit (HOSPITAL_COMMUNITY): Payer: Self-pay

## 2022-04-03 ENCOUNTER — Other Ambulatory Visit: Payer: Self-pay | Admitting: Medical-Surgical

## 2022-04-05 ENCOUNTER — Other Ambulatory Visit (HOSPITAL_COMMUNITY): Payer: Self-pay

## 2022-04-05 MED ORDER — AMPHETAMINE-DEXTROAMPHETAMINE 20 MG PO TABS
20.0000 mg | ORAL_TABLET | Freq: Two times a day (BID) | ORAL | 0 refills | Status: DC
  Filled 2022-04-05: qty 60, 30d supply, fill #0

## 2022-04-05 MED ORDER — AMPHETAMINE-DEXTROAMPHETAMINE 20 MG PO TABS: 20.0000 mg | ORAL_TABLET | Freq: Two times a day (BID) | ORAL | 0 refills | Status: DC

## 2022-04-05 MED ORDER — AMPHETAMINE-DEXTROAMPHETAMINE 20 MG PO TABS
20.0000 mg | ORAL_TABLET | Freq: Two times a day (BID) | ORAL | 0 refills | Status: DC
  Filled 2022-05-15: qty 60, 30d supply, fill #0

## 2022-04-05 NOTE — Telephone Encounter (Signed)
Last office visit 12/20/2021  Last filled 02/18/2022

## 2022-04-07 DIAGNOSIS — J0100 Acute maxillary sinusitis, unspecified: Secondary | ICD-10-CM | POA: Insufficient documentation

## 2022-04-12 DIAGNOSIS — G4733 Obstructive sleep apnea (adult) (pediatric): Secondary | ICD-10-CM | POA: Diagnosis not present

## 2022-05-15 ENCOUNTER — Other Ambulatory Visit: Payer: Self-pay | Admitting: Osteopathic Medicine

## 2022-05-15 ENCOUNTER — Other Ambulatory Visit (HOSPITAL_COMMUNITY): Payer: Self-pay

## 2022-05-23 ENCOUNTER — Other Ambulatory Visit (HOSPITAL_COMMUNITY): Payer: Self-pay

## 2022-05-23 MED ORDER — FLUTICASONE PROPIONATE 50 MCG/ACT NA SUSP
1.0000 | Freq: Every day | NASAL | 1 refills | Status: DC
  Filled 2022-05-23: qty 16, 60d supply, fill #0

## 2022-05-23 MED ORDER — PSEUDOEPHEDRINE HCL ER 120 MG PO TB12
120.0000 mg | ORAL_TABLET | Freq: Two times a day (BID) | ORAL | 0 refills | Status: DC | PRN
  Filled 2022-05-23 (×2): qty 180, 90d supply, fill #0

## 2022-05-24 ENCOUNTER — Other Ambulatory Visit: Payer: Self-pay | Admitting: Osteopathic Medicine

## 2022-05-24 ENCOUNTER — Other Ambulatory Visit (HOSPITAL_COMMUNITY): Payer: Self-pay

## 2022-05-25 ENCOUNTER — Other Ambulatory Visit (HOSPITAL_COMMUNITY): Payer: Self-pay

## 2022-05-25 MED ORDER — MONTELUKAST SODIUM 10 MG PO TABS
10.0000 mg | ORAL_TABLET | Freq: Every day | ORAL | 0 refills | Status: DC
  Filled 2022-05-25: qty 90, 90d supply, fill #0

## 2022-05-25 MED ORDER — ATORVASTATIN CALCIUM 20 MG PO TABS
20.0000 mg | ORAL_TABLET | Freq: Every day | ORAL | 0 refills | Status: DC
  Filled 2022-05-25: qty 90, 90d supply, fill #0

## 2022-05-26 ENCOUNTER — Other Ambulatory Visit (HOSPITAL_COMMUNITY): Payer: Self-pay

## 2022-06-22 ENCOUNTER — Other Ambulatory Visit (HOSPITAL_COMMUNITY): Payer: Self-pay

## 2022-06-22 ENCOUNTER — Encounter: Payer: Self-pay | Admitting: Medical-Surgical

## 2022-06-22 ENCOUNTER — Ambulatory Visit (INDEPENDENT_AMBULATORY_CARE_PROVIDER_SITE_OTHER): Payer: 59 | Admitting: Medical-Surgical

## 2022-06-22 VITALS — BP 120/75 | HR 106 | Resp 20 | Ht 68.0 in | Wt 219.1 lb

## 2022-06-22 DIAGNOSIS — M545 Low back pain, unspecified: Secondary | ICD-10-CM

## 2022-06-22 DIAGNOSIS — I10 Essential (primary) hypertension: Secondary | ICD-10-CM | POA: Diagnosis not present

## 2022-06-22 DIAGNOSIS — J309 Allergic rhinitis, unspecified: Secondary | ICD-10-CM

## 2022-06-22 DIAGNOSIS — E6609 Other obesity due to excess calories: Secondary | ICD-10-CM

## 2022-06-22 DIAGNOSIS — R0982 Postnasal drip: Secondary | ICD-10-CM

## 2022-06-22 DIAGNOSIS — Z6833 Body mass index (BMI) 33.0-33.9, adult: Secondary | ICD-10-CM | POA: Diagnosis not present

## 2022-06-22 DIAGNOSIS — Z1329 Encounter for screening for other suspected endocrine disorder: Secondary | ICD-10-CM | POA: Diagnosis not present

## 2022-06-22 DIAGNOSIS — Z0000 Encounter for general adult medical examination without abnormal findings: Secondary | ICD-10-CM | POA: Diagnosis not present

## 2022-06-22 DIAGNOSIS — E782 Mixed hyperlipidemia: Secondary | ICD-10-CM

## 2022-06-22 DIAGNOSIS — G8929 Other chronic pain: Secondary | ICD-10-CM

## 2022-06-22 DIAGNOSIS — F909 Attention-deficit hyperactivity disorder, unspecified type: Secondary | ICD-10-CM

## 2022-06-22 MED ORDER — PSEUDOEPHEDRINE HCL ER 120 MG PO TB12
120.0000 mg | ORAL_TABLET | Freq: Two times a day (BID) | ORAL | 0 refills | Status: DC | PRN
  Filled 2022-06-22 – 2022-09-24 (×2): qty 180, 90d supply, fill #0

## 2022-06-22 MED ORDER — MONTELUKAST SODIUM 10 MG PO TABS
10.0000 mg | ORAL_TABLET | Freq: Every day | ORAL | 0 refills | Status: DC
  Filled 2022-06-22 – 2022-08-28 (×3): qty 90, 90d supply, fill #0

## 2022-06-22 MED ORDER — LISINOPRIL 20 MG PO TABS
ORAL_TABLET | Freq: Every day | ORAL | 3 refills | Status: DC
  Filled 2022-06-22: qty 90, 90d supply, fill #0
  Filled 2022-09-21: qty 90, 90d supply, fill #1
  Filled 2022-12-24: qty 90, 90d supply, fill #2
  Filled 2023-03-21: qty 90, 90d supply, fill #3

## 2022-06-22 MED ORDER — ATORVASTATIN CALCIUM 20 MG PO TABS
20.0000 mg | ORAL_TABLET | Freq: Every day | ORAL | 0 refills | Status: DC
  Filled 2022-06-22 – 2022-08-28 (×2): qty 90, 90d supply, fill #0

## 2022-06-22 MED ORDER — FLUTICASONE PROPIONATE 50 MCG/ACT NA SUSP
1.0000 | Freq: Every day | NASAL | 1 refills | Status: DC
  Filled 2022-06-22: qty 48, 180d supply, fill #0
  Filled 2022-07-24: qty 16, 60d supply, fill #0
  Filled 2022-09-21: qty 16, 60d supply, fill #1
  Filled 2022-11-27: qty 16, 60d supply, fill #2
  Filled 2023-01-26: qty 16, 60d supply, fill #3
  Filled 2023-04-04: qty 16, 60d supply, fill #4
  Filled 2023-06-20: qty 16, 60d supply, fill #5

## 2022-06-22 MED ORDER — TIZANIDINE HCL 4 MG PO TABS
ORAL_TABLET | ORAL | 1 refills | Status: DC
  Filled 2022-06-22: qty 90, 90d supply, fill #0
  Filled 2022-09-21: qty 90, 90d supply, fill #1

## 2022-06-22 MED ORDER — AMPHETAMINE-DEXTROAMPHETAMINE 20 MG PO TABS
20.0000 mg | ORAL_TABLET | Freq: Two times a day (BID) | ORAL | 0 refills | Status: DC
  Filled 2022-06-22: qty 60, 30d supply, fill #0

## 2022-06-22 MED ORDER — AMPHETAMINE-DEXTROAMPHETAMINE 20 MG PO TABS
20.0000 mg | ORAL_TABLET | Freq: Two times a day (BID) | ORAL | 0 refills | Status: DC
  Filled 2022-08-28: qty 60, 30d supply, fill #0

## 2022-06-22 MED ORDER — AMPHETAMINE-DEXTROAMPHETAMINE 20 MG PO TABS
20.0000 mg | ORAL_TABLET | Freq: Two times a day (BID) | ORAL | 0 refills | Status: DC
  Filled 2022-07-24: qty 60, 30d supply, fill #0

## 2022-06-22 NOTE — Progress Notes (Signed)
Complete physical exam  Patient: Stephen Steele   DOB: 27-Sep-1990   31 y.o. Male  MRN: [id number]  Subjective:    Chief Complaint  Patient presents with   Annual Exam    Hao Dion is a 31 y.o. male who presents today for a complete physical exam. He reports consuming a general diet.  Boxing and kickboxing at least once a week.   He generally feels well. He reports sleeping well. He does have additional problems to discuss today.    Most recent fall risk assessment:    12/20/2021    2:06 PM  [place] in the past year? 0  Number falls in past yr: 0  Injury with Fall? 0  Risk for fall due to : No Fall Risks  Follow up Falls evaluation completed     Most recent depression screenings:    06/22/2022   10:53 AM 12/20/2021    2:06 PM  PHQ 2/9 Scores  PHQ - 2 Score 0 0  PHQ- 9 Score  2    Vision:Within last year, Dental: No current dental problems and Receives regular dental care, and STD: The patient denies history of sexually transmitted disease.    Patient Care Team: Samuel Bouche, NP as PCP - General (Nurse Practitioner)   Outpatient Medications Prior to Visit  Medication Sig   AMBULATORY NON [place] ordered: CPAP and other supplies needed (headgear, cushions, filters, heated tuubing and water chamber) Dx: obstructive sleep apnea Settings: auto-titration 5-20 cmH2O   [DISCONTINUED] amphetamine-dextroamphetamine (ADDERALL) 20 MG tablet Take 1 tablet (20 mg total) by mouth 2 (two) times daily.   [DISCONTINUED] amphetamine-dextroamphetamine (ADDERALL) 20 MG tablet Take 1 tablet (20 mg total) by mouth 2 (two) times daily.   [DISCONTINUED] amphetamine-dextroamphetamine (ADDERALL) 20 MG tablet Take 1 tablet (20 mg total) by mouth 2 (two) times daily.   [DISCONTINUED] atorvastatin (LIPITOR) 20 MG tablet Take 1 tablet (20 mg total) by mouth daily.   [DISCONTINUED] fluticasone (FLONASE) 50 MCG/ACT nasal spray Place 1 spray into both nostrils daily.    [DISCONTINUED] lisinopril (ZESTRIL) 20 MG tablet TAKE 1 TABLET BY MOUTH ONCE A DAY   [DISCONTINUED] montelukast (SINGULAIR) 10 MG tablet Take 1 tablet (10 mg total) by mouth daily.   [DISCONTINUED] pseudoephedrine (SUDAFED) 120 MG 12 hr tablet Take 1 tablet (120 mg total) by mouth every 12 (twelve) hours as needed for congestion.   [DISCONTINUED] tiZANidine (ZANAFLEX) 4 MG tablet Take 1 tablet by mouth at bedtime as needed for muscle spasms.   No facility-administered medications prior to visit.    Review of Systems  Constitutional:  Positive for malaise/fatigue. Negative for chills and fever.  HENT:  Positive for congestion, sinus pain and sore throat.   Respiratory:  Negative for cough, sputum production, shortness of breath and wheezing.   Cardiovascular:  Negative for chest pain, palpitations, orthopnea and leg swelling.  Gastrointestinal: Negative.   Genitourinary: Negative.   Neurological:  Positive for headaches. Negative for dizziness and weakness.  Endo/Heme/Allergies:  Positive for environmental allergies.  Psychiatric/Behavioral:  Negative for depression and suicidal ideas. The patient is not nervous/anxious and does not have insomnia.      Objective:    BP 120/75 (BP Location: Left Arm, Cuff Size: Large)   Pulse (!) 106   Resp 20   Ht 5\' 8"  (1.727 m)   Wt 219 lb 1.6 oz (99.4 kg)   SpO2 99%   BMI 33.31 kg/m    Physical Exam Vitals  reviewed.  Constitutional:      General: He is not in acute distress.    Appearance: Normal appearance. He is not ill-appearing.  HENT:     Head: Normocephalic and atraumatic.     Right Ear: Tympanic membrane, ear canal and external ear normal. There is no impacted cerumen.     Left Ear: Tympanic membrane, ear canal and external ear normal. There is no impacted cerumen.     Nose: Nose normal. No congestion or rhinorrhea.     Mouth/Throat:     Mouth: Mucous membranes are moist.     Pharynx: No oropharyngeal exudate or posterior  oropharyngeal erythema.  Eyes:     General: No scleral icterus.       Right eye: No discharge.        Left eye: No discharge.     Extraocular Movements: Extraocular movements intact.     Conjunctiva/sclera: Conjunctivae normal.     Pupils: Pupils are equal, round, and reactive to light.  Neck:     Thyroid: No thyromegaly.     Vascular: No carotid bruit or JVD.     Trachea: Trachea normal.  Cardiovascular:     Rate and Rhythm: Normal rate and regular rhythm.     Pulses: Normal pulses.     Heart sounds: Normal heart sounds. No murmur heard.    No friction rub. No gallop.  Pulmonary:     Effort: Pulmonary effort is normal. No respiratory distress.     Breath sounds: Normal breath sounds. No wheezing.  Abdominal:     General: Bowel sounds are normal. There is no distension.     Palpations: Abdomen is soft.     Tenderness: There is no abdominal tenderness. There is no guarding.  Musculoskeletal:        General: Normal range of motion.     Cervical back: Normal range of motion and neck supple.  Lymphadenopathy:     Cervical: No cervical adenopathy.  Skin:    General: Skin is warm and dry.  Neurological:     Mental Status: He is alert and oriented to person, place, and time.     Cranial Nerves: No cranial nerve deficit.  Psychiatric:        Mood and Affect: Mood normal.        Behavior: Behavior normal.        Thought Content: Thought content normal.        Judgment: Judgment normal.    No results found for any visits on 06/22/22.     Assessment & Plan:    Routine Health Maintenance and Physical Exam  Immunization History  Administered Date(s) Administered   Influenza,inj,Quad PF,6+ Mos 05/02/2021   Influenza,trivalent, recombinat, inj, PF 05/28/2016   Influenza-Unspecified 05/19/2019, 05/11/2022   PFIZER(Purple Top)SARS-COV-2 Vaccination 08/11/2019, 09/01/2019    Health Maintenance  Topic Date Due   COVID-19 Vaccine (3 - Pfizer risk series) 07/08/2022 (Originally  09/29/2019)   TETANUS/TDAP  01/03/2024   INFLUENZA VACCINE  Completed   Hepatitis C Screening  Completed   HIV Screening  Completed   HPV VACCINES  Aged Out    Discussed health benefits of physical activity, and encouraged him to engage in regular exercise appropriate for his age and condition.  1. Annual physical exam Checking labs as below.  Up-to-date on preventative care.  Wellness information provided with AVS. - Lipid panel - COMPLETE METABOLIC PANEL WITH GFR - CBC with Differential/Platelet  2. Hypertension goal BP (blood pressure) < 130/80 Blood  pressure at goal today.  Continue lisinopril 20 mg daily.  Low-sodium diet, regular intentional exercise, and weight loss to healthy weight recommended.  Continue monitoring blood pressure at home with a goal of 130/80 or less. - lisinopril (ZESTRIL) 20 MG tablet; TAKE 1 TABLET BY MOUTH ONCE A DAY  Dispense: 90 tablet; Refill: 3  3. Class 1 obesity due to excess calories with serious comorbidity and body mass index (BMI) of 33.0 to 33.9 in adult Checking hemoglobin A1c. - Hemoglobin A1c  4. Thyroid disorder screen Checking TSH. - TSH  5. Adult ADHD Long-term treatment with Adderall.  Continue Adderall instant release 20 mg twice daily as prescribed.  Refill sent. - amphetamine-dextroamphetamine (ADDERALL) 20 MG tablet; Take 1 tablet (20 mg total) by mouth 2 (two) times daily.  Dispense: 60 tablet; Refill: 0 - amphetamine-dextroamphetamine (ADDERALL) 20 MG tablet; Take 1 tablet (20 mg total) by mouth 2 (two) times daily.  Dispense: 60 tablet; Refill: 0 - amphetamine-dextroamphetamine (ADDERALL) 20 MG tablet; Take 1 tablet (20 mg total) by mouth 2 (two) times daily.  Dispense: 60 tablet; Refill: 0  6. Mixed hyperlipidemia Checking lipid panel today.  Continue atorvastatin 20 mg daily. - atorvastatin (LIPITOR) 20 MG tablet; Take 1 tablet (20 mg total) by mouth daily.  Dispense: 90 tablet; Refill: 0  7. Allergic rhinitis with postnasal  drip Discussed chronic symptoms.  Unfortunately, this has been a long-term issue for him.  He has never been evaluated by ENT.  Offered referral but declined today.  He will wait for our new insurance to see what kind of coverage there may be.  For now, continue Flonase, Singulair, and Sudafed as prescribed. - montelukast (SINGULAIR) 10 MG tablet; Take 1 tablet (10 mg total) by mouth daily.  Dispense: 90 tablet; Refill: 0 - fluticasone (FLONASE) 50 MCG/ACT nasal spray; Place 1 spray into both nostrils daily.  Dispense: 48 g; Refill: 1 - pseudoephedrine (SUDAFED) 120 MG 12 hr tablet; Take 1 tablet (120 mg total) by mouth every 12 (twelve) hours as needed for congestion.  Dispense: 180 tablet; Refill: 0  8. Chronic bilateral low back pain without sciatica Continue tizanidine 4 mg nightly as needed. - tiZANidine (ZANAFLEX) 4 MG tablet; Take 1 tablet by mouth at bedtime as needed for muscle spasms.  Dispense: 90 tablet; Refill: 1  Return in about 6 months (around 12/21/2022) for ADHD follow up.   Christen Butter, NP

## 2022-07-12 DIAGNOSIS — G4733 Obstructive sleep apnea (adult) (pediatric): Secondary | ICD-10-CM | POA: Diagnosis not present

## 2022-07-18 DIAGNOSIS — Z1329 Encounter for screening for other suspected endocrine disorder: Secondary | ICD-10-CM | POA: Diagnosis not present

## 2022-07-18 DIAGNOSIS — Z6833 Body mass index (BMI) 33.0-33.9, adult: Secondary | ICD-10-CM | POA: Diagnosis not present

## 2022-07-18 DIAGNOSIS — E6609 Other obesity due to excess calories: Secondary | ICD-10-CM | POA: Diagnosis not present

## 2022-07-18 DIAGNOSIS — Z0000 Encounter for general adult medical examination without abnormal findings: Secondary | ICD-10-CM | POA: Diagnosis not present

## 2022-07-19 LAB — LIPID PANEL
Cholesterol: 171 mg/dL (ref <200)
HDL: 34 mg/dL — ABNORMAL LOW (ref >=40)
LDL Cholesterol (Calc): 122 mg/dL (calc) — ABNORMAL HIGH
Non-HDL Cholesterol (Calc): 137 mg/dL (calc) — ABNORMAL HIGH (ref <130)
Total CHOL/HDL Ratio: 5 (calc) — ABNORMAL HIGH (ref <5.0)
Triglycerides: 60 mg/dL (ref <150)

## 2022-07-19 LAB — COMPLETE METABOLIC PANEL WITH GFR
AG Ratio: 1.9 (calc) (ref 1.0–2.5)
ALT: 68 U/L — ABNORMAL HIGH (ref 9–46)
AST: 27 U/L (ref 10–40)
Albumin: 5 g/dL (ref 3.6–5.1)
Alkaline phosphatase (APISO): 67 U/L (ref 36–130)
BUN: 17 mg/dL (ref 7–25)
CO2: 29 mmol/L (ref 20–32)
Calcium: 10 mg/dL (ref 8.6–10.3)
Chloride: 98 mmol/L (ref 98–110)
Creat: 1.21 mg/dL (ref 0.60–1.26)
Globulin: 2.6 g/dL (calc) (ref 1.9–3.7)
Glucose, Bld: 102 mg/dL — ABNORMAL HIGH (ref 65–99)
Potassium: 4.4 mmol/L (ref 3.5–5.3)
Sodium: 136 mmol/L (ref 135–146)
Total Bilirubin: 0.6 mg/dL (ref 0.2–1.2)
Total Protein: 7.6 g/dL (ref 6.1–8.1)
eGFR: 83 mL/min/{1.73_m2} (ref >=60)

## 2022-07-19 LAB — CBC WITH DIFFERENTIAL/PLATELET
Absolute Monocytes: 338 cells/uL (ref 200–950)
Basophils Absolute: 38 cells/uL (ref 0–200)
Basophils Relative: 1 %
Eosinophils Absolute: 110 cells/uL (ref 15–500)
Eosinophils Relative: 2.9 %
HCT: 44.4 % (ref 38.5–50.0)
Hemoglobin: 14.9 g/dL (ref 13.2–17.1)
Lymphs Abs: 1319 cells/uL (ref 850–3900)
MCH: 30.5 pg (ref 27.0–33.0)
MCHC: 33.6 g/dL (ref 32.0–36.0)
MCV: 90.8 fL (ref 80.0–100.0)
MPV: 9.3 fL (ref 7.5–12.5)
Monocytes Relative: 8.9 %
Neutro Abs: 1995 cells/uL (ref 1500–7800)
Neutrophils Relative %: 52.5 %
Platelets: 275 10*3/uL (ref 140–400)
RBC: 4.89 10*6/uL (ref 4.20–5.80)
RDW: 12.5 % (ref 11.0–15.0)
Total Lymphocyte: 34.7 %
WBC: 3.8 10*3/uL (ref 3.8–10.8)

## 2022-07-19 LAB — HEMOGLOBIN A1C
Hgb A1c MFr Bld: 5.5 % of total Hgb (ref <5.7)
Mean Plasma Glucose: 111 mg/dL
eAG (mmol/L): 6.2 mmol/L

## 2022-07-19 LAB — TSH: TSH: 1.39 mIU/L (ref 0.40–4.50)

## 2022-07-24 ENCOUNTER — Other Ambulatory Visit (HOSPITAL_COMMUNITY): Payer: Self-pay

## 2022-08-28 ENCOUNTER — Other Ambulatory Visit: Payer: Self-pay

## 2022-08-28 ENCOUNTER — Other Ambulatory Visit (HOSPITAL_COMMUNITY): Payer: Self-pay

## 2022-09-21 ENCOUNTER — Other Ambulatory Visit: Payer: Self-pay | Admitting: Medical-Surgical

## 2022-09-21 ENCOUNTER — Other Ambulatory Visit: Payer: Self-pay

## 2022-09-21 DIAGNOSIS — F909 Attention-deficit hyperactivity disorder, unspecified type: Secondary | ICD-10-CM

## 2022-09-21 MED ORDER — AMPHETAMINE-DEXTROAMPHETAMINE 20 MG PO TABS
20.0000 mg | ORAL_TABLET | Freq: Two times a day (BID) | ORAL | 0 refills | Status: DC
  Filled 2022-09-27: qty 60, 30d supply, fill #0

## 2022-09-22 ENCOUNTER — Other Ambulatory Visit (HOSPITAL_COMMUNITY): Payer: Self-pay

## 2022-09-25 ENCOUNTER — Other Ambulatory Visit (HOSPITAL_COMMUNITY): Payer: Self-pay

## 2022-09-26 ENCOUNTER — Other Ambulatory Visit (HOSPITAL_COMMUNITY): Payer: Self-pay

## 2022-09-27 ENCOUNTER — Other Ambulatory Visit (HOSPITAL_COMMUNITY): Payer: Self-pay

## 2022-09-28 ENCOUNTER — Other Ambulatory Visit (HOSPITAL_COMMUNITY): Payer: Self-pay

## 2022-09-29 ENCOUNTER — Other Ambulatory Visit (HOSPITAL_COMMUNITY): Payer: Self-pay

## 2022-11-10 DIAGNOSIS — G4733 Obstructive sleep apnea (adult) (pediatric): Secondary | ICD-10-CM | POA: Diagnosis not present

## 2022-11-15 ENCOUNTER — Other Ambulatory Visit: Payer: Self-pay | Admitting: Medical-Surgical

## 2022-11-15 ENCOUNTER — Other Ambulatory Visit (HOSPITAL_COMMUNITY): Payer: Self-pay

## 2022-11-15 DIAGNOSIS — F909 Attention-deficit hyperactivity disorder, unspecified type: Secondary | ICD-10-CM

## 2022-11-15 MED ORDER — AMPHETAMINE-DEXTROAMPHETAMINE 20 MG PO TABS
20.0000 mg | ORAL_TABLET | Freq: Two times a day (BID) | ORAL | 0 refills | Status: DC
  Filled 2022-11-15: qty 60, 30d supply, fill #0

## 2022-11-15 MED ORDER — AMPHETAMINE-DEXTROAMPHETAMINE 20 MG PO TABS: 20.0000 mg | ORAL_TABLET | Freq: Two times a day (BID) | ORAL | 0 refills | Status: DC

## 2022-11-17 ENCOUNTER — Other Ambulatory Visit (HOSPITAL_COMMUNITY): Payer: Self-pay

## 2022-11-20 ENCOUNTER — Other Ambulatory Visit: Payer: Self-pay | Admitting: Medical-Surgical

## 2022-11-20 DIAGNOSIS — J309 Allergic rhinitis, unspecified: Secondary | ICD-10-CM

## 2022-11-20 DIAGNOSIS — E782 Mixed hyperlipidemia: Secondary | ICD-10-CM

## 2022-11-21 ENCOUNTER — Other Ambulatory Visit (HOSPITAL_COMMUNITY): Payer: Self-pay

## 2022-11-21 MED ORDER — MONTELUKAST SODIUM 10 MG PO TABS
10.0000 mg | ORAL_TABLET | Freq: Every day | ORAL | 0 refills | Status: DC
  Filled 2022-11-21: qty 90, 90d supply, fill #0

## 2022-11-21 MED ORDER — ATORVASTATIN CALCIUM 20 MG PO TABS
20.0000 mg | ORAL_TABLET | Freq: Every day | ORAL | 0 refills | Status: DC
  Filled 2022-11-21: qty 90, 90d supply, fill #0

## 2022-11-27 ENCOUNTER — Other Ambulatory Visit (HOSPITAL_COMMUNITY): Payer: Self-pay

## 2022-11-27 ENCOUNTER — Other Ambulatory Visit: Payer: Self-pay

## 2022-12-11 DIAGNOSIS — G4733 Obstructive sleep apnea (adult) (pediatric): Secondary | ICD-10-CM | POA: Diagnosis not present

## 2022-12-16 ENCOUNTER — Other Ambulatory Visit (HOSPITAL_COMMUNITY): Payer: Self-pay

## 2022-12-21 ENCOUNTER — Other Ambulatory Visit (HOSPITAL_COMMUNITY): Payer: Self-pay

## 2022-12-21 ENCOUNTER — Ambulatory Visit (INDEPENDENT_AMBULATORY_CARE_PROVIDER_SITE_OTHER): Payer: 59 | Admitting: Medical-Surgical

## 2022-12-21 VITALS — BP 114/76 | HR 103 | Resp 20 | Ht 68.0 in | Wt 216.0 lb

## 2022-12-21 DIAGNOSIS — F909 Attention-deficit hyperactivity disorder, unspecified type: Secondary | ICD-10-CM | POA: Diagnosis not present

## 2022-12-21 DIAGNOSIS — E782 Mixed hyperlipidemia: Secondary | ICD-10-CM | POA: Diagnosis not present

## 2022-12-21 DIAGNOSIS — I10 Essential (primary) hypertension: Secondary | ICD-10-CM | POA: Diagnosis not present

## 2022-12-21 MED ORDER — AMPHETAMINE-DEXTROAMPHETAMINE 20 MG PO TABS
20.0000 mg | ORAL_TABLET | Freq: Two times a day (BID) | ORAL | 0 refills | Status: DC
Start: 2022-12-21 — End: 2023-04-04
  Filled 2022-12-21: qty 60, 30d supply, fill #0

## 2022-12-21 MED ORDER — ATORVASTATIN CALCIUM 40 MG PO TABS
40.0000 mg | ORAL_TABLET | Freq: Every day | ORAL | 3 refills | Status: DC
Start: 2022-12-21 — End: 2023-06-26
  Filled 2022-12-21: qty 90, 90d supply, fill #0
  Filled 2023-03-21: qty 90, 90d supply, fill #1
  Filled 2023-06-20: qty 90, 90d supply, fill #2

## 2022-12-21 MED ORDER — AMPHETAMINE-DEXTROAMPHETAMINE 20 MG PO TABS
20.0000 mg | ORAL_TABLET | Freq: Two times a day (BID) | ORAL | 0 refills | Status: DC
  Filled 2023-03-05: qty 60, 30d supply, fill #0

## 2022-12-21 MED ORDER — AMPHETAMINE-DEXTROAMPHETAMINE 20 MG PO TABS
20.0000 mg | ORAL_TABLET | Freq: Two times a day (BID) | ORAL | 0 refills | Status: DC
  Filled 2023-01-26: qty 60, 30d supply, fill #0

## 2022-12-21 NOTE — Progress Notes (Signed)
        Established patient visit  History, exam, impression, and plan:  1. Hypertension goal BP (blood pressure) < 130/80 Stephen Steele 32 year old male presenting today with a history of hypertension currently treated with lisinopril 20 mg daily.  Tolerating medication well without side effects.  Following a low-sodium diet.  Activity as tolerated.  HRRR, S1/S2 normal.  No peripheral edema.  Lungs CTA.  Respirations even and unlabored.  Denies CP, SOB, palpitations, HA, LE edema, dizziness, and syncopal episodes.  Blood pressure at goal today.  Continue lisinopril as prescribed.  2. Adult ADHD Long history of ADHD well-managed with Adderall 20 mg twice daily.  Tolerates the medication well without side effects.  No palpitations, chest pain, shortness of breath, constipation, insomnia, or unexpected weight changes.  Symptoms well-controlled.  Continue Adderall 20 mg twice daily. - amphetamine-dextroamphetamine (ADDERALL) 20 MG tablet; Take 1 tablet (20 mg total) by mouth 2 (two) times daily.  Dispense: 60 tablet; Refill: 0 - amphetamine-dextroamphetamine (ADDERALL) 20 MG tablet; Take 1 tablet (20 mg total) by mouth 2 (two) times daily.  Dispense: 60 tablet; Refill: 0 - amphetamine-dextroamphetamine (ADDERALL) 20 MG tablet; Take 1 tablet (20 mg total) by mouth 2 (two) times daily.  Dispense: 60 tablet; Refill: 0  3. Mixed hyperlipidemia History of hyperlipidemia currently treated with atorvastatin 20 mg daily.  As of his last labs, it was a recommendation that we could increase to 40 mg daily or allow him to work on dietary modifications, weight loss, and regular intentional activity.  No reply was received so we continue the 20 mg daily dosing.  Today he reports that he would like to go up to a atorvastatin 40 mg daily.  Increasing the dose as requested with plan to recheck labs in 6-8 weeks to evaluate tolerance and response. - Lipid panel - COMPLETE METABOLIC PANEL WITH GFR - atorvastatin (LIPITOR)  40 MG tablet; Take 1 tablet (40 mg total) by mouth daily.  Dispense: 90 tablet; Refill: 3   Procedures performed this visit: None.  Return in about 6 months (around 06/23/2023) for ADHD follow up.  __________________________________ Thayer Ohm, DNP, APRN, FNP-BC Primary Care and Sports Medicine [facility] [place]

## 2022-12-25 ENCOUNTER — Other Ambulatory Visit (HOSPITAL_COMMUNITY): Payer: Self-pay

## 2023-01-10 ENCOUNTER — Other Ambulatory Visit (HOSPITAL_COMMUNITY): Payer: Self-pay

## 2023-01-10 ENCOUNTER — Other Ambulatory Visit: Payer: Self-pay | Admitting: Medical-Surgical

## 2023-01-10 DIAGNOSIS — G4733 Obstructive sleep apnea (adult) (pediatric): Secondary | ICD-10-CM | POA: Diagnosis not present

## 2023-01-10 DIAGNOSIS — M545 Low back pain, unspecified: Secondary | ICD-10-CM

## 2023-01-10 MED ORDER — TIZANIDINE HCL 4 MG PO TABS
ORAL_TABLET | ORAL | 1 refills | Status: DC
Start: 2023-01-10 — End: 2023-06-26
  Filled 2023-01-10: qty 90, 90d supply, fill #0
  Filled 2023-04-13: qty 90, 90d supply, fill #1

## 2023-01-17 ENCOUNTER — Other Ambulatory Visit: Payer: Self-pay | Admitting: Medical-Surgical

## 2023-01-17 DIAGNOSIS — J309 Allergic rhinitis, unspecified: Secondary | ICD-10-CM

## 2023-01-19 ENCOUNTER — Other Ambulatory Visit (HOSPITAL_COMMUNITY): Payer: Self-pay

## 2023-01-22 ENCOUNTER — Other Ambulatory Visit (HOSPITAL_COMMUNITY): Payer: Self-pay

## 2023-01-22 ENCOUNTER — Telehealth: Payer: Self-pay | Admitting: Medical-Surgical

## 2023-01-22 NOTE — Telephone Encounter (Unsigned)
Patient called requesting a refill of ; Sudafed 120mg  12hr tablet Patient is asking why it was denied Pharmacy ;  [facility]

## 2023-01-22 NOTE — Telephone Encounter (Signed)
It looks like the refill was managed through the refill pool.  Sudafed is not a medication that we recommend to be used with ADHD medications or for long-term treatment of sinus issues.  It is intended for infrequent use and only on an as needed basis.  Ideally, we should be using other medications for management of sinus issues that pose less risks and systemic side effects.

## 2023-01-24 ENCOUNTER — Encounter: Payer: Self-pay | Admitting: Medical-Surgical

## 2023-01-25 NOTE — Telephone Encounter (Signed)
Message sent to patient with Christen Butter, NP response.

## 2023-01-26 ENCOUNTER — Other Ambulatory Visit: Payer: Self-pay

## 2023-01-26 ENCOUNTER — Other Ambulatory Visit (HOSPITAL_COMMUNITY): Payer: Self-pay

## 2023-02-06 DIAGNOSIS — F528 Other sexual dysfunction not due to a substance or known physiological condition: Secondary | ICD-10-CM | POA: Diagnosis not present

## 2023-02-06 DIAGNOSIS — F4323 Adjustment disorder with mixed anxiety and depressed mood: Secondary | ICD-10-CM | POA: Diagnosis not present

## 2023-02-08 DIAGNOSIS — F528 Other sexual dysfunction not due to a substance or known physiological condition: Secondary | ICD-10-CM | POA: Diagnosis not present

## 2023-02-08 DIAGNOSIS — G4733 Obstructive sleep apnea (adult) (pediatric): Secondary | ICD-10-CM | POA: Diagnosis not present

## 2023-02-08 DIAGNOSIS — F4323 Adjustment disorder with mixed anxiety and depressed mood: Secondary | ICD-10-CM | POA: Diagnosis not present

## 2023-02-19 ENCOUNTER — Other Ambulatory Visit: Payer: Self-pay | Admitting: Medical-Surgical

## 2023-02-19 DIAGNOSIS — F4323 Adjustment disorder with mixed anxiety and depressed mood: Secondary | ICD-10-CM | POA: Diagnosis not present

## 2023-02-19 DIAGNOSIS — J309 Allergic rhinitis, unspecified: Secondary | ICD-10-CM

## 2023-02-19 DIAGNOSIS — F528 Other sexual dysfunction not due to a substance or known physiological condition: Secondary | ICD-10-CM | POA: Diagnosis not present

## 2023-02-20 ENCOUNTER — Other Ambulatory Visit (HOSPITAL_COMMUNITY): Payer: Self-pay

## 2023-02-21 MED ORDER — MONTELUKAST SODIUM 10 MG PO TABS
10.0000 mg | ORAL_TABLET | Freq: Every day | ORAL | 0 refills | Status: AC
Start: 2023-02-21 — End: ?
  Filled 2023-02-21: qty 90, 90d supply, fill #0

## 2023-02-23 ENCOUNTER — Other Ambulatory Visit (HOSPITAL_COMMUNITY): Payer: Self-pay

## 2023-02-27 DIAGNOSIS — F4323 Adjustment disorder with mixed anxiety and depressed mood: Secondary | ICD-10-CM | POA: Diagnosis not present

## 2023-02-27 DIAGNOSIS — F528 Other sexual dysfunction not due to a substance or known physiological condition: Secondary | ICD-10-CM | POA: Diagnosis not present

## 2023-03-05 ENCOUNTER — Other Ambulatory Visit: Payer: Self-pay

## 2023-03-05 ENCOUNTER — Other Ambulatory Visit (HOSPITAL_COMMUNITY): Payer: Self-pay

## 2023-03-06 DIAGNOSIS — F528 Other sexual dysfunction not due to a substance or known physiological condition: Secondary | ICD-10-CM | POA: Diagnosis not present

## 2023-03-06 DIAGNOSIS — F4323 Adjustment disorder with mixed anxiety and depressed mood: Secondary | ICD-10-CM | POA: Diagnosis not present

## 2023-03-08 ENCOUNTER — Other Ambulatory Visit: Payer: Self-pay | Admitting: Oncology

## 2023-03-08 DIAGNOSIS — Z006 Encounter for examination for normal comparison and control in clinical research program: Secondary | ICD-10-CM

## 2023-03-10 DIAGNOSIS — G4733 Obstructive sleep apnea (adult) (pediatric): Secondary | ICD-10-CM | POA: Diagnosis not present

## 2023-03-13 DIAGNOSIS — F528 Other sexual dysfunction not due to a substance or known physiological condition: Secondary | ICD-10-CM | POA: Diagnosis not present

## 2023-03-13 DIAGNOSIS — F4323 Adjustment disorder with mixed anxiety and depressed mood: Secondary | ICD-10-CM | POA: Diagnosis not present

## 2023-03-14 ENCOUNTER — Ambulatory Visit
Admission: EM | Admit: 2023-03-14 | Discharge: 2023-03-14 | Disposition: A | Discharge status: Home / Self Care | Payer: 59 | Attending: Family Medicine | Admitting: Family Medicine

## 2023-03-14 ENCOUNTER — Encounter: Payer: Self-pay | Admitting: Emergency Medicine

## 2023-03-14 ENCOUNTER — Other Ambulatory Visit: Payer: Self-pay

## 2023-03-14 DIAGNOSIS — H18821 Corneal disorder due to contact lens, right eye: Secondary | ICD-10-CM

## 2023-03-14 MED ORDER — MOXIFLOXACIN HCL 0.5 % OP SOLN: 1.0000 [drp] | Freq: Three times a day (TID) | OPHTHALMIC | 0 refills | Status: DC

## 2023-03-14 NOTE — ED Triage Notes (Signed)
Patient presents to [facility] for evaluation of right eye pain since last night.  States he is having significant burning when he tries to keep his eye open for extended periods of time.  Denies changes in his, as patient states, already terrible vision.

## 2023-03-14 NOTE — Discharge Instructions (Addendum)
Use the eye drop 3 x a day for a week Cool compresses to eye for discomfort May take tylenol or ibuprofen for pain See your eye doctor in follow up.  Call today to be seen this week or early next week

## 2023-03-14 NOTE — ED Provider Notes (Signed)
[facility]    CSN: [id number] Arrival date & time: 03/14/23  4098      History   Chief Complaint Chief Complaint  Patient presents with   Eye Pain    HPI Stephen Steele is a 32 y.o. male.   HPI  Patient has eye irritation since yesterday.  He is a contact lens wearer.  He does not have any trauma.  He has some redness and photophobia.  Vision is not impaired.  Patient has hypertension and hyperlipidemia and is on medication at a young age. Past Medical History:  Diagnosis Date   Acute bilateral low back pain without sciatica 07/24/2017   Adult ADHD 04/13/2017   Allergy    Chronic left shoulder pain    Hypertension 04/13/2017   Mixed hyperlipidemia 04/16/2017   Obesity    Pleural effusion on right 10/25/2017   Sleep apnea Late 2019-Early 2020   managed with CPaP   Tachycardia with heart rate 100-120 beats per minute 01/02/2018    Patient Active Problem List   Diagnosis Date Noted   Acute maxillary sinusitis, unspecified 04/07/2022   Hypersomnolence 02/04/2019   OSA on CPAP 02/04/2019   Chronic bilateral low back pain without sciatica 02/04/2019   Pulmonary nodules 07/29/2018   Allergic rhinitis with postnasal drip 04/08/2018   Snoring 01/02/2018   Mixed hyperlipidemia 04/16/2017   Adult ADHD 04/13/2017   Class 1 obesity due to excess calories with serious comorbidity and body mass index (BMI) of 30.0 to 30.9 in adult 04/13/2017   Hypertension goal BP (blood pressure) < 130/80 04/13/2017   Wears contact lenses 04/13/2017    Past Surgical History:  Procedure Laterality Date   FRACTURE SURGERY  2016   right hand; 3rd, 4th, 5th metacarpal fracture   HAND SURGERY Right    TONSILLECTOMY  1997       Home Medications    Prior to Admission medications   Medication Sig Start Date End Date Taking? Authorizing Provider  AMBULATORY NON FORMULARY MEDICATION Supply ordered: CPAP and other supplies needed (headgear, cushions, filters, heated tuubing  and water chamber) Dx: obstructive sleep apnea Settings: auto-titration 5-20 cmH2O 10/26/20   Sunnie Nielsen, DO  amphetamine-dextroamphetamine (ADDERALL) 20 MG tablet Take 1 tablet (20 mg) by mouth 2 times daily. 02/19/23   Christen Butter, NP  amphetamine-dextroamphetamine (ADDERALL) 20 MG tablet Take 1 tablet (20 mg total) by mouth 2 (two) times daily. 01/20/23   Christen Butter, NP  amphetamine-dextroamphetamine (ADDERALL) 20 MG tablet Take 1 tablet (20 mg total) by mouth 2 (two) times daily. 12/21/22   Christen Butter, NP  atorvastatin (LIPITOR) 40 MG tablet Take 1 tablet (40 mg total) by mouth daily. 12/21/22   Christen Butter, NP  fluticasone (FLONASE) 50 MCG/ACT nasal spray Place 1 spray into both nostrils daily. 06/22/22   Christen Butter, NP  lisinopril (ZESTRIL) 20 MG tablet TAKE 1 TABLET BY MOUTH ONCE A DAY 06/22/22 06/22/23  Christen Butter, NP  montelukast (SINGULAIR) 10 MG tablet Take 1 tablet (10 mg total) by mouth daily. 02/21/23   Christen Butter, NP  moxifloxacin (VIGAMOX) 0.5 % ophthalmic solution Place 1 drop into the right eye 3 (three) times daily. 03/14/23  Yes Eustace Moore, MD  tiZANidine (ZANAFLEX) 4 MG tablet Take 1 tablet by mouth at bedtime as needed for muscle spasms. 01/10/23   Christen Butter, NP    Family History Family History  Problem Relation Age of Onset   Depression Mother    Hypercholesterolemia Father  Depression Sister    ADD / ADHD Sister    Stroke Paternal Grandfather    Cancer Paternal Grandfather    Heart attack Paternal Grandfather    Heart disease Paternal Grandfather    Diabetes Maternal Aunt     Social History Social History   Tobacco Use   Smoking status: Former    Current packs/day: 0.00    Average packs/day: 0.5 packs/day for 2.0 years (1.0 ttl pk-yrs)    Types: Cigarettes, E-cigarettes    Start date: 04/13/2010    Quit date: 04/13/2012    Years since quitting: 10.9   Smokeless tobacco: Never   Tobacco comments:    Successfully transitioned from tobacco to  E-cigarettes in 2013. Discontinued vaping in April 2022.  Vaping Use   Vaping status: Some Days  Substance Use Topics   Alcohol use: Not Currently   Drug use: Not Currently     Allergies   Patient has no known allergies.   Review of Systems Review of Systems See HPI  Physical Exam Triage Vital Signs ED Triage Vitals  Encounter Vitals Group     BP 03/14/23 0856 132/82     Systolic BP Percentile --      Diastolic BP Percentile --      Pulse Rate 03/14/23 0856 (!) 104     Resp 03/14/23 0856 18     Temp --      Temp src --      SpO2 03/14/23 0856 98 %     Weight --      Height --      Head Circumference --      Peak Flow --      Pain Score 03/14/23 0858 7     Pain Loc --      Pain Education --      Exclude from Growth Chart --    No data found.  Updated Vital Signs BP 132/82   Pulse (!) 104   Resp 18   SpO2 98%    Physical Exam Constitutional:      General: He is not in acute distress.    Appearance: He is well-developed.  HENT:     Head: Normocephalic and atraumatic.  Eyes:     General: Lids are normal. Vision grossly intact.        Right eye: Foreign body and discharge present.     Conjunctiva/sclera:     Right eye: Right conjunctiva is injected.     Left eye: Left conjunctiva is not injected.     Pupils: Pupils are equal, round, and reactive to light.   Cardiovascular:     Rate and Rhythm: Normal rate.  Pulmonary:     Effort: Pulmonary effort is normal. No respiratory distress.  Abdominal:     General: There is no distension.     Palpations: Abdomen is soft.  Musculoskeletal:        General: Normal range of motion.     Cervical back: Normal range of motion.  Skin:    General: Skin is warm and dry.  Neurological:     Mental Status: He is alert.      UC Treatments / Results  Labs (all labs ordered are listed, but only abnormal results are displayed) Labs Reviewed - No data to display  EKG   Radiology No results  found.  Procedures Procedures (including critical care time)  Medications Ordered in UC Medications - No data to display  Initial Impression / Assessment and Plan /  UC Course  I have reviewed the triage vital signs and the nursing notes.  Pertinent labs & imaging results that were available during my care of the patient were reviewed by me and considered in my medical decision making (see chart for details).    Final Clinical Impressions(s) / UC Diagnoses   Final diagnoses:  Corneal abrasion of right eye due to contact lens     Discharge Instructions      Use the eye drop 3 x a day for a week Cool compresses to eye for discomfort May take tylenol or ibuprofen for pain See your eye doctor in follow up.  Call today to be seen this week or early next week     ED Prescriptions     Medication Sig Dispense Auth. Provider   moxifloxacin (VIGAMOX) 0.5 % ophthalmic solution Place 1 drop into the right eye 3 (three) times daily. 3 mL Eustace Moore, MD      PDMP not reviewed this encounter.   Eustace Moore, MD 03/14/23 [phone]

## 2023-03-20 DIAGNOSIS — F4323 Adjustment disorder with mixed anxiety and depressed mood: Secondary | ICD-10-CM | POA: Diagnosis not present

## 2023-03-20 DIAGNOSIS — F528 Other sexual dysfunction not due to a substance or known physiological condition: Secondary | ICD-10-CM | POA: Diagnosis not present

## 2023-04-03 DIAGNOSIS — F4323 Adjustment disorder with mixed anxiety and depressed mood: Secondary | ICD-10-CM | POA: Diagnosis not present

## 2023-04-03 DIAGNOSIS — F528 Other sexual dysfunction not due to a substance or known physiological condition: Secondary | ICD-10-CM | POA: Diagnosis not present

## 2023-04-04 ENCOUNTER — Other Ambulatory Visit: Payer: Self-pay

## 2023-04-04 ENCOUNTER — Other Ambulatory Visit: Payer: Self-pay | Admitting: Medical-Surgical

## 2023-04-04 ENCOUNTER — Other Ambulatory Visit (HOSPITAL_COMMUNITY): Payer: Self-pay

## 2023-04-04 DIAGNOSIS — F909 Attention-deficit hyperactivity disorder, unspecified type: Secondary | ICD-10-CM

## 2023-04-04 MED ORDER — AMPHETAMINE-DEXTROAMPHETAMINE 20 MG PO TABS
20.0000 mg | ORAL_TABLET | Freq: Two times a day (BID) | ORAL | 0 refills | Status: AC
Start: 2023-04-04 — End: ?
  Filled 2023-04-04: qty 60, 30d supply, fill #0

## 2023-04-04 MED ORDER — AMPHETAMINE-DEXTROAMPHETAMINE 20 MG PO TABS
20.0000 mg | ORAL_TABLET | Freq: Two times a day (BID) | ORAL | 0 refills | Status: DC
Start: 2023-06-03 — End: 2023-06-26

## 2023-04-04 MED ORDER — AMPHETAMINE-DEXTROAMPHETAMINE 20 MG PO TABS: 20.0000 mg | ORAL_TABLET | Freq: Two times a day (BID) | ORAL | 0 refills | Status: DC

## 2023-04-10 DIAGNOSIS — G4733 Obstructive sleep apnea (adult) (pediatric): Secondary | ICD-10-CM | POA: Diagnosis not present

## 2023-04-17 DIAGNOSIS — F4323 Adjustment disorder with mixed anxiety and depressed mood: Secondary | ICD-10-CM | POA: Diagnosis not present

## 2023-04-17 DIAGNOSIS — F528 Other sexual dysfunction not due to a substance or known physiological condition: Secondary | ICD-10-CM | POA: Diagnosis not present

## 2023-04-28 ENCOUNTER — Ambulatory Visit
Admission: EM | Admit: 2023-04-28 | Discharge: 2023-04-28 | Disposition: A | Discharge status: Home / Self Care | Payer: 59 | Attending: Family Medicine | Admitting: Family Medicine

## 2023-04-28 ENCOUNTER — Other Ambulatory Visit: Payer: Self-pay

## 2023-04-28 ENCOUNTER — Ambulatory Visit (INDEPENDENT_AMBULATORY_CARE_PROVIDER_SITE_OTHER): Payer: 59

## 2023-04-28 ENCOUNTER — Encounter: Payer: Self-pay | Admitting: *Deleted

## 2023-04-28 DIAGNOSIS — M25532 Pain in left wrist: Secondary | ICD-10-CM | POA: Diagnosis not present

## 2023-04-28 DIAGNOSIS — M79642 Pain in left hand: Secondary | ICD-10-CM | POA: Diagnosis not present

## 2023-04-28 DIAGNOSIS — S6992XA Unspecified injury of left wrist, hand and finger(s), initial encounter: Secondary | ICD-10-CM | POA: Diagnosis not present

## 2023-04-28 MED ORDER — CELECOXIB 200 MG PO CAPS: 200.0000 mg | ORAL_CAPSULE | Freq: Every day | ORAL | 0 refills | Status: AC

## 2023-04-28 NOTE — Discharge Instructions (Addendum)
Advised patient to take medication as directed with food to completion.  Advised patient to RICE affected area of left hand for the 30 minutes 3 times daily for the next 3 days.  Encouraged increase daily water intake to 64 ounces per day while taking this medication.  Advised if symptoms worsen and/or unresolved please follow-up with [facility] orthopedic provider for further evaluation contact information has been provided with his AVS today.

## 2023-04-28 NOTE — ED Provider Notes (Signed)
[facility] C[facility]    CSN: [id number] Arrival date & time: 04/28/23  1238      History   Chief Complaint Chief Complaint  Patient presents with   Hand Injury    HPI Stephen Steele is a 32 y.o. male.   HPI 32 year old male presents with left wrist pain secondary to injury on skateboard 1.5 hours ago.  PMH significant for adult ADHD, obesity and sleep apnea.  Past Medical History:  Diagnosis Date   Acute bilateral low back pain without sciatica 07/24/2017   Adult ADHD 04/13/2017   Allergy    Chronic left shoulder pain    Hypertension 04/13/2017   Mixed hyperlipidemia 04/16/2017   Obesity    Pleural effusion on right 10/25/2017   Sleep apnea Late 2019-Early 2020   managed with CPaP   Tachycardia with heart rate 100-120 beats per minute 01/02/2018    Patient Active Problem List   Diagnosis Date Noted   Acute maxillary sinusitis, unspecified 04/07/2022   Hypersomnolence 02/04/2019   OSA on CPAP 02/04/2019   Chronic bilateral low back pain without sciatica 02/04/2019   Pulmonary nodules 07/29/2018   Allergic rhinitis with postnasal drip 04/08/2018   Snoring 01/02/2018   Mixed hyperlipidemia 04/16/2017   Adult ADHD 04/13/2017   Class 1 obesity due to excess calories with serious comorbidity and body mass index (BMI) of 30.0 to 30.9 in adult 04/13/2017   Hypertension goal BP (blood pressure) < 130/80 04/13/2017   Wears contact lenses 04/13/2017    Past Surgical History:  Procedure Laterality Date   FRACTURE SURGERY  2016   right hand; 3rd, 4th, 5th metacarpal fracture   HAND SURGERY Right    TONSILLECTOMY  1997       Home Medications    Prior to Admission medications   Medication Sig Start Date End Date Taking? Authorizing Provider  amphetamine-dextroamphetamine (ADDERALL) 20 MG tablet Take 1 tablet (20 mg total) by mouth 2 (two) times daily. 06/03/23  Yes Christen Butter, NP  atorvastatin (LIPITOR) 40 MG tablet Take 1 tablet (40 mg total) by mouth daily.  12/21/22  Yes Christen Butter, NP  celecoxib (CELEBREX) 200 MG capsule Take 1 capsule (200 mg total) by mouth daily for 15 days. 04/28/23 05/13/23 Yes Trevor Iha, FNP  fluticasone (FLONASE) 50 MCG/ACT nasal spray Place 1 spray into both nostrils daily. 06/22/22  Yes Jessup, Joy, NP  lisinopril (ZESTRIL) 20 MG tablet TAKE 1 TABLET BY MOUTH ONCE A DAY 06/22/22 06/24/23 Yes Jessup, Joy, NP  montelukast (SINGULAIR) 10 MG tablet Take 1 tablet (10 mg total) by mouth daily. 02/21/23  Yes Christen Butter, NP  tiZANidine (ZANAFLEX) 4 MG tablet Take 1 tablet by mouth at bedtime as needed for muscle spasms. 01/10/23  Yes Christen Butter, NP  AMBULATORY NON FORMULARY MEDICATION Supply ordered: CPAP and other supplies needed (headgear, cushions, filters, heated tuubing and water chamber) Dx: obstructive sleep apnea Settings: auto-titration 5-20 cmH2O 10/26/20   Sunnie Nielsen, DO  amphetamine-dextroamphetamine (ADDERALL) 20 MG tablet Take 1 tablet (20 mg) by mouth 2 times daily. 04/04/23   Christen Butter, NP  amphetamine-dextroamphetamine (ADDERALL) 20 MG tablet Take 1 tablet (20 mg total) by mouth 2 (two) times daily. 05/04/23   Christen Butter, NP  moxifloxacin (VIGAMOX) 0.5 % ophthalmic solution Place 1 drop into the right eye 3 (three) times daily. 03/14/23   Eustace Moore, MD    Family History Family History  Problem Relation Age of Onset   Depression Mother    Hypercholesterolemia Father  Depression Sister    ADD / ADHD Sister    Stroke Paternal Grandfather    Cancer Paternal Grandfather    Heart attack Paternal Grandfather    Heart disease Paternal Grandfather    Diabetes Maternal Aunt     Social History Social History   Tobacco Use   Smoking status: Former    Current packs/day: 0.00    Average packs/day: 0.5 packs/day for 2.0 years (1.0 ttl pk-yrs)    Types: Cigarettes, E-cigarettes    Start date: 04/13/2010    Quit date: 04/13/2012    Years since quitting: 11.0   Smokeless tobacco: Never   Tobacco  comments:    Successfully transitioned from tobacco to E-cigarettes in 2013. Discontinued vaping in April 2022.  Vaping Use   Vaping status: Former  Substance Use Topics   Alcohol use: Not Currently   Drug use: Not Currently     Allergies   Patient has no known allergies.   Review of Systems Review of Systems  Musculoskeletal:        Left wrist pain secondary to left wrist injury while skateboarding 1.5 hours ago.     Physical Exam Triage Vital Signs ED Triage Vitals  Encounter Vitals Group     BP      Systolic BP Percentile      Diastolic BP Percentile      Pulse      Resp      Temp      Temp src      SpO2      Weight      Height      Head Circumference      Peak Flow      Pain Score      Pain Loc      Pain Education      Exclude from Growth Chart    No data found.  Updated Vital Signs BP 112/76   Pulse (!) 109   Temp 98.4 F (36.9 C) (Oral)   Resp 18   SpO2 98%   Physical Exam Vitals and nursing note reviewed.  Constitutional:      Appearance: Stephen Steele is obese.  HENT:     Head: Normocephalic and atraumatic.     Mouth/Throat:     Mouth: Mucous membranes are moist.     Pharynx: Oropharynx is clear.  Eyes:     Extraocular Movements: Extraocular movements intact.     Conjunctiva/sclera: Conjunctivae normal.     Pupils: Pupils are equal, round, and reactive to light.  Cardiovascular:     Rate and Rhythm: Normal rate and regular rhythm.     Pulses: Normal pulses.     Heart sounds: Normal heart sounds.  Pulmonary:     Effort: Pulmonary effort is normal.     Breath sounds: Normal breath sounds. No wheezing, rhonchi or rales.  Musculoskeletal:        General: Normal range of motion.     Cervical back: Normal range of motion and neck supple.     Comments: Left wrist: Limited flexion/extension, moderate soft tissue swelling noted exam limited due to pain  Skin:    General: Skin is warm and dry.  Neurological:     General: No focal deficit present.      Mental Status: Stephen Steele is alert and oriented to person, place, and time. Mental status is at baseline.  Psychiatric:        Mood and Affect: Mood normal.  Behavior: Behavior normal.      UC Treatments / Results  Labs (all labs ordered are listed, but only abnormal results are displayed) Labs Reviewed - No data to display  EKG   Radiology DG Hand Complete Left  Result Date: 04/28/2023 CLINICAL DATA:  Post fall, now with left hand and wrist pain. EXAM: LEFT HAND - COMPLETE 3+ VIEW COMPARISON:  None Available. FINDINGS: No fracture or dislocation. Joint spaces are preserved. No erosions. No evidence of chondrocalcinosis. Regional soft tissues appear normal. IMPRESSION: No explanation for patient's left hand pain. Specifically, no fracture or dislocation. If the patient has pain referable to the anatomic snuff box, splinting and a follow-up radiograph in 10 to 14 days is recommended to evaluate for occult scaphoid fracture. Electronically Signed   By: Simonne Come M.D.   On: 04/28/2023 13:56    Procedures Procedures (including critical care time)  Medications Ordered in UC Medications - No data to display  Initial Impression / Assessment and Plan / UC Course  I have reviewed the triage vital signs and the nursing notes.  Pertinent labs & imaging results that were available during my care of the patient were reviewed by me and considered in my medical decision making (see chart for details).     MDM: 1.  Left hand pain-left hand x-ray result revealed above, Rx'd Celebrex 200 mg capsule daily x 15 days; 2.  Injury of left hand, initial encounter-ACE wrap placed on left hand prior to discharge today. Advised patient to take medication as directed with food to completion.  Advised patient to RICE affected area of left hand for the 30 minutes 3 times daily for the next 3 days.  Encouraged increase daily water intake to 64 ounces per day while taking this medication.  Advised if symptoms  worsen and/or unresolved please follow-up with [facility] orthopedic provider for further evaluation contact information has been provided with his AVS today.  Patient discharged home, hemodynamically stable. Final Clinical Impressions(s) / UC Diagnoses   Final diagnoses:  Left hand pain  Injury of left hand, initial encounter     Discharge Instructions      Advised patient to take medication as directed with food to completion.  Advised patient to RICE affected area of left hand for the 30 minutes 3 times daily for the next 3 days.  Encouraged increase daily water intake to 64 ounces per day while taking this medication.  Advised if symptoms worsen and/or unresolved please follow-up with [facility] orthopedic provider for further evaluation contact information has been provided with his AVS today.     ED Prescriptions     Medication Sig Dispense Auth. Provider   celecoxib (CELEBREX) 200 MG capsule Take 1 capsule (200 mg total) by mouth daily for 15 days. 15 capsule Trevor Iha, FNP      PDMP not reviewed this encounter.   Trevor Iha, FNP 04/28/23 1416

## 2023-04-28 NOTE — ED Triage Notes (Addendum)
Reports fallx2 from skateboard approx 1.5 hrs ago, onto palm of left hand both times. LUE CMS intact. LUE ring removed and is in patient's possession. Has taken Tyl and applied ice. Tenderness noted to left hand, no tenderness to left wrist with palpation.

## 2023-05-09 DIAGNOSIS — G4733 Obstructive sleep apnea (adult) (pediatric): Secondary | ICD-10-CM | POA: Diagnosis not present

## 2023-05-15 DIAGNOSIS — F528 Other sexual dysfunction not due to a substance or known physiological condition: Secondary | ICD-10-CM | POA: Diagnosis not present

## 2023-05-15 DIAGNOSIS — F4323 Adjustment disorder with mixed anxiety and depressed mood: Secondary | ICD-10-CM | POA: Diagnosis not present

## 2023-05-24 ENCOUNTER — Other Ambulatory Visit (HOSPITAL_COMMUNITY): Payer: Self-pay

## 2023-05-24 ENCOUNTER — Other Ambulatory Visit: Payer: Self-pay | Admitting: Medical-Surgical

## 2023-05-24 DIAGNOSIS — J309 Allergic rhinitis, unspecified: Secondary | ICD-10-CM

## 2023-05-24 DIAGNOSIS — F909 Attention-deficit hyperactivity disorder, unspecified type: Secondary | ICD-10-CM

## 2023-05-24 MED ORDER — MONTELUKAST SODIUM 10 MG PO TABS
10.0000 mg | ORAL_TABLET | Freq: Every day | ORAL | 0 refills | Status: DC
Start: 2023-05-24 — End: 2023-06-26
  Filled 2023-05-24: qty 90, 90d supply, fill #0

## 2023-05-24 MED ORDER — AMPHETAMINE-DEXTROAMPHETAMINE 20 MG PO TABS
20.0000 mg | ORAL_TABLET | Freq: Two times a day (BID) | ORAL | 0 refills | Status: DC
Start: 2023-05-24 — End: 2023-06-26
  Filled 2023-05-24: qty 60, 30d supply, fill #0

## 2023-06-08 DIAGNOSIS — G4733 Obstructive sleep apnea (adult) (pediatric): Secondary | ICD-10-CM | POA: Diagnosis not present

## 2023-06-20 ENCOUNTER — Other Ambulatory Visit: Payer: Self-pay

## 2023-06-20 ENCOUNTER — Other Ambulatory Visit: Payer: Self-pay | Admitting: Medical-Surgical

## 2023-06-20 DIAGNOSIS — I10 Essential (primary) hypertension: Secondary | ICD-10-CM

## 2023-06-20 NOTE — Telephone Encounter (Signed)
Upcoming Appointment 06/26/2023

## 2023-06-21 ENCOUNTER — Other Ambulatory Visit (HOSPITAL_COMMUNITY): Payer: Self-pay

## 2023-06-21 MED ORDER — LISINOPRIL 20 MG PO TABS
ORAL_TABLET | Freq: Every day | ORAL | 0 refills | Status: DC
Start: 2023-06-21 — End: 2023-06-26
  Filled 2023-06-21: qty 90, 90d supply, fill #0

## 2023-06-25 ENCOUNTER — Other Ambulatory Visit (HOSPITAL_COMMUNITY): Payer: Self-pay

## 2023-06-26 ENCOUNTER — Other Ambulatory Visit (HOSPITAL_COMMUNITY): Payer: Self-pay

## 2023-06-26 ENCOUNTER — Ambulatory Visit: Payer: 59 | Admitting: Medical-Surgical

## 2023-06-26 ENCOUNTER — Encounter: Payer: Self-pay | Admitting: Medical-Surgical

## 2023-06-26 VITALS — BP 111/68 | HR 108 | Resp 20 | Ht 68.0 in | Wt 213.0 lb

## 2023-06-26 DIAGNOSIS — R0982 Postnasal drip: Secondary | ICD-10-CM | POA: Diagnosis not present

## 2023-06-26 DIAGNOSIS — M545 Low back pain, unspecified: Secondary | ICD-10-CM | POA: Diagnosis not present

## 2023-06-26 DIAGNOSIS — G8929 Other chronic pain: Secondary | ICD-10-CM | POA: Diagnosis not present

## 2023-06-26 DIAGNOSIS — J309 Allergic rhinitis, unspecified: Secondary | ICD-10-CM | POA: Diagnosis not present

## 2023-06-26 DIAGNOSIS — I10 Essential (primary) hypertension: Secondary | ICD-10-CM | POA: Diagnosis not present

## 2023-06-26 DIAGNOSIS — F909 Attention-deficit hyperactivity disorder, unspecified type: Secondary | ICD-10-CM | POA: Diagnosis not present

## 2023-06-26 DIAGNOSIS — E782 Mixed hyperlipidemia: Secondary | ICD-10-CM

## 2023-06-26 MED ORDER — ATORVASTATIN CALCIUM 40 MG PO TABS
40.0000 mg | ORAL_TABLET | Freq: Every day | ORAL | 3 refills | Status: DC
  Filled 2023-06-26 – 2023-09-18 (×2): qty 90, 90d supply, fill #0
  Filled 2023-12-17: qty 90, 90d supply, fill #1

## 2023-06-26 MED ORDER — AMPHETAMINE-DEXTROAMPHETAMINE 20 MG PO TABS: 20.0000 mg | ORAL_TABLET | Freq: Two times a day (BID) | ORAL | 0 refills | Status: DC

## 2023-06-26 MED ORDER — TIZANIDINE HCL 4 MG PO TABS
ORAL_TABLET | ORAL | 3 refills | Status: DC
  Filled 2023-06-26: qty 90, 90d supply, fill #0
  Filled 2023-10-11: qty 90, 90d supply, fill #1
  Filled 2024-01-08: qty 90, 90d supply, fill #2

## 2023-06-26 MED ORDER — MONTELUKAST SODIUM 10 MG PO TABS
10.0000 mg | ORAL_TABLET | Freq: Every day | ORAL | 3 refills | Status: DC
  Filled 2023-06-26 – 2023-08-20 (×2): qty 90, 90d supply, fill #0
  Filled 2023-11-26: qty 90, 90d supply, fill #1
  Filled 2024-03-10: qty 90, 90d supply, fill #2

## 2023-06-26 MED ORDER — AMPHETAMINE-DEXTROAMPHETAMINE 20 MG PO TABS
20.0000 mg | ORAL_TABLET | Freq: Two times a day (BID) | ORAL | 0 refills | Status: DC
  Filled 2023-06-26: qty 60, 30d supply, fill #0

## 2023-06-26 MED ORDER — LISINOPRIL 20 MG PO TABS
20.0000 mg | ORAL_TABLET | Freq: Every day | ORAL | 3 refills | Status: DC
  Filled 2023-06-26: qty 90, fill #0
  Filled 2023-08-20 – 2023-09-18 (×2): qty 90, 90d supply, fill #0
  Filled 2023-12-17: qty 90, 90d supply, fill #1

## 2023-06-26 MED ORDER — AMPHETAMINE-DEXTROAMPHETAMINE 20 MG PO TABS
20.0000 mg | ORAL_TABLET | Freq: Two times a day (BID) | ORAL | 0 refills | Status: DC
Start: 2023-08-25 — End: 2023-09-11

## 2023-06-26 NOTE — Progress Notes (Signed)
        Established patient visit  History, exam, impression, and plan:  1. Adult ADHD Pleasant 32 year old male presenting today with a history of adult ADHD.  He has been treated long-term on Adderall 20 mg instant release twice daily.  Averages 2 doses on most days and feels that the medication works well for him.  No side effects, difficulty sleeping, appetite suppression, or unexpected weight fluctuations.  Continue Adderall twice daily as prescribed. - amphetamine-dextroamphetamine (ADDERALL) 20 MG tablet; Take 1 tablet (20 mg total) by mouth 2 (two) times daily.  Dispense: 60 tablet; Refill: 0 - amphetamine-dextroamphetamine (ADDERALL) 20 MG tablet; Take 1 tablet (20 mg total) by mouth 2 (two) times daily.  Dispense: 60 tablet; Refill: 0 - amphetamine-dextroamphetamine (ADDERALL) 20 MG tablet; Take 1 tablet (20 mg) by mouth 2 times daily.  Dispense: 60 tablet; Refill: 0  2. Mixed hyperlipidemia History of mixed hyperlipidemia currently treated with atorvastatin.  Approximately 6 months ago, was advised to increase his dose to 40 mg daily and follow-up in 6/8 weeks for repeat of labs.  He did increase his dose but was unable to get the labs drawn.  Plan to check labs today.  Continue atorvastatin as prescribed. - CMP14+EGFR - Lipid panel - atorvastatin (LIPITOR) 40 MG tablet; Take 1 tablet (40 mg total) by mouth daily.  Dispense: 90 tablet; Refill: 3  3. Hypertension goal BP (blood pressure) < 130/80 Has a history of hypertension that is currently managed with lisinopril 20 mg daily.  Not regularly checking blood pressures at home.  Does still add some salt but not excessively.  Not regularly exercising.  Denies concerning symptoms.  Blood pressure is at goal today.  Exam is reassuring.  Continue lisinopril as prescribed. - lisinopril (ZESTRIL) 20 MG tablet; Take 1 tablet (20 mg total) by mouth daily.  Dispense: 90 tablet; Refill: 3  4. Allergic rhinitis with postnasal drip Long history  of allergic rhinitis with postnasal drip which is currently well-managed with singular 10 mg daily.  Continue as prescribed. - montelukast (SINGULAIR) 10 MG tablet; Take 1 tablet (10 mg total) by mouth daily.  Dispense: 90 tablet; Refill: 3  5. Chronic bilateral low back pain without sciatica History of low back pain with intermittent flares.  Using tizanidine 4 mg nightly as needed which works well and helps with sleep.  Continue tizanidine as prescribed. - tiZANidine (ZANAFLEX) 4 MG tablet; Take 1 tablet by mouth at bedtime as needed for muscle spasms.  Dispense: 90 tablet; Refill: 3   Procedures performed this visit: None.  Return in about 6 months (around 12/24/2023) for HTN/HLD/ADHD follow up.  __________________________________ Thayer Ohm, DNP, APRN, FNP-BC Primary Care and Sports Medicine [facility]

## 2023-06-27 ENCOUNTER — Encounter: Payer: Self-pay | Admitting: Medical-Surgical

## 2023-06-27 DIAGNOSIS — Z3009 Encounter for other general counseling and advice on contraception: Secondary | ICD-10-CM

## 2023-06-27 LAB — LIPID PANEL
Chol/HDL Ratio: 4.9 ratio (ref 0.0–5.0)
Cholesterol, Total: 146 mg/dL (ref 100–199)
HDL: 30 mg/dL — ABNORMAL LOW (ref >39)
LDL Chol Calc (NIH): 90 mg/dL (ref 0–99)
Triglycerides: 143 mg/dL (ref 0–149)
VLDL Cholesterol Cal: 26 mg/dL (ref 5–40)

## 2023-06-27 LAB — CMP14+EGFR
ALT: 55 [IU]/L — ABNORMAL HIGH (ref 0–44)
AST: 28 [IU]/L (ref 0–40)
Albumin: 4.8 g/dL (ref 4.1–5.1)
Alkaline Phosphatase: 105 [IU]/L (ref 44–121)
BUN/Creatinine Ratio: 16 (ref 9–20)
BUN: 17 mg/dL (ref 6–20)
Bilirubin Total: 0.4 mg/dL (ref 0.0–1.2)
CO2: 23 mmol/L (ref 20–29)
Calcium: 9.7 mg/dL (ref 8.7–10.2)
Chloride: 101 mmol/L (ref 96–106)
Creatinine, Ser: 1.06 mg/dL (ref 0.76–1.27)
Globulin, Total: 2.1 g/dL (ref 1.5–4.5)
Glucose: 85 mg/dL (ref 70–99)
Potassium: 4.4 mmol/L (ref 3.5–5.2)
Sodium: 138 mmol/L (ref 134–144)
Total Protein: 6.9 g/dL (ref 6.0–8.5)
eGFR: 96 mL/min/{1.73_m2} (ref >59)

## 2023-07-04 NOTE — Progress Notes (Unsigned)
Assessment: 1. Consultation for sterilization      Plan: Today I had a long and detailed discussion with the patient regarding vasa ectomy.  As to the procedure, no scalpel technique vasectomy is explained and reviewed in detail.  Generalized risks including but not limited to bleeding, infection, orchalgia, testicular atrophy, epididymitis, scrotal hematoma, and chronic pain are discussed.   Additionally, he understands that the possibility of vas recanalization following vasectomy is possible although rare.  Most importantly, the patient understands that he is not sterile initially and will need a semen analysis check to confirm sterility such that no sperm are seen.  He is advised to avoid ejaculation for 10 days following the procedure.  The initial semen analysis will be checked in approximately 12 weeks and in some patients, several months may be required for clearance of all sperm.  He reports a clear understanding of the need for continued birth control until sterility is confirmed.  Otherwise, general issues regarding local anesthesia, prep, alprazolam are discussed and he reports a clear understanding.   Chief Complaint: Patient interested in vasa ectomy  History of Present Illness:  Stephen Steele is a 32 y.o. male who is seen in consultation from Christen Butter, NP for sterilization consultation. Patient and his wife have carefully considered permanent sterilization with vasectomy and are comfortable with her decision.   Past Medical History:  Past Medical History:  Diagnosis Date   Acute bilateral low back pain without sciatica 07/24/2017   Adult ADHD 04/13/2017   Allergy    Chronic left shoulder pain    Hypertension 04/13/2017   Mixed hyperlipidemia 04/16/2017   Obesity    Pleural effusion on right 10/25/2017   Sleep apnea Late 2019-Early 2020   managed with CPaP   Tachycardia with heart rate 100-120 beats per minute 01/02/2018    Past Surgical History:  Past Surgical  History:  Procedure Laterality Date   FRACTURE SURGERY  2016   right hand; 3rd, 4th, 5th metacarpal fracture   HAND SURGERY Right    TONSILLECTOMY  1997    Allergies:  No Known Allergies  Family History:  Family History  Problem Relation Age of Onset   Depression Mother    Hypercholesterolemia Father    Depression Sister    ADD / ADHD Sister    Stroke Paternal Grandfather    Cancer Paternal Grandfather    Heart attack Paternal Grandfather    Heart disease Paternal Grandfather    Diabetes Maternal Aunt     Social History:  Social History   Tobacco Use   Smoking status: Former    Current packs/day: 0.00    Average packs/day: 0.5 packs/day for 2.0 years (1.0 ttl pk-yrs)    Types: Cigarettes, E-cigarettes    Start date: 04/13/2010    Quit date: 04/13/2012    Years since quitting: 11.2   Smokeless tobacco: Never   Tobacco comments:    Successfully transitioned from tobacco to E-cigarettes in 2013. Discontinued vaping in April 2022.  Vaping Use   Vaping status: Former  Substance Use Topics   Alcohol use: Not Currently   Drug use: Not Currently    Review of symptoms:  Constitutional:  Negative for unexplained weight loss, night sweats, fever, chills ENT:  Negative for nose bleeds, sinus pain, painful swallowing CV:  Negative for chest pain, shortness of breath, exercise intolerance, palpitations, loss of consciousness Resp:  Negative for cough, wheezing, shortness of breath GI:  Negative for nausea, vomiting, diarrhea, bloody stools GU:  Positives noted in HPI; otherwise negative for gross hematuria, dysuria, urinary incontinence Neuro:  Negative for seizures, poor balance, limb weakness, slurred speech Psych:  Negative for lack of energy, depression, anxiety Endocrine:  Negative for polydipsia, polyuria, symptoms of hypoglycemia (dizziness, hunger, sweating) Hematologic:  Negative for anemia, purpura, petechia, prolonged or excessive bleeding, use of anticoagulants   Allergic:  Negative for difficulty breathing or choking as a result of exposure to anything; no shellfish allergy; no allergic response (rash/itch) to materials, foods  Physical exam: There were no vitals taken for this visit. GENERAL APPEARANCE:  Well appearing, well developed, well nourished, NAD  GU:

## 2023-07-05 ENCOUNTER — Ambulatory Visit (INDEPENDENT_AMBULATORY_CARE_PROVIDER_SITE_OTHER): Payer: 59 | Admitting: Urology

## 2023-07-05 ENCOUNTER — Encounter: Payer: Self-pay | Admitting: Urology

## 2023-07-05 VITALS — BP 122/77 | HR 105 | Ht 68.0 in | Wt 220.0 lb

## 2023-07-05 DIAGNOSIS — Z3009 Encounter for other general counseling and advice on contraception: Secondary | ICD-10-CM | POA: Diagnosis not present

## 2023-07-09 DIAGNOSIS — G4733 Obstructive sleep apnea (adult) (pediatric): Secondary | ICD-10-CM | POA: Diagnosis not present

## 2023-08-20 ENCOUNTER — Other Ambulatory Visit: Payer: Self-pay

## 2023-08-20 ENCOUNTER — Other Ambulatory Visit (HOSPITAL_COMMUNITY): Payer: Self-pay

## 2023-08-20 ENCOUNTER — Other Ambulatory Visit: Payer: Self-pay | Admitting: Medical-Surgical

## 2023-08-20 DIAGNOSIS — J309 Allergic rhinitis, unspecified: Secondary | ICD-10-CM

## 2023-08-23 ENCOUNTER — Other Ambulatory Visit (HOSPITAL_COMMUNITY): Payer: Self-pay

## 2023-08-23 MED ORDER — FLUTICASONE PROPIONATE 50 MCG/ACT NA SUSP
1.0000 | Freq: Every day | NASAL | 1 refills | Status: DC
  Filled 2023-08-23: qty 16, 60d supply, fill #0
  Filled 2023-10-22: qty 16, 60d supply, fill #1
  Filled 2023-12-17: qty 16, 60d supply, fill #2
  Filled 2024-03-10: qty 16, 60d supply, fill #3
  Filled 2024-05-05: qty 16, 60d supply, fill #4
  Filled 2024-07-07: qty 16, 60d supply, fill #5

## 2023-08-24 ENCOUNTER — Other Ambulatory Visit (HOSPITAL_COMMUNITY): Payer: Self-pay

## 2023-09-11 ENCOUNTER — Encounter: Payer: Self-pay | Admitting: Medical-Surgical

## 2023-09-11 ENCOUNTER — Other Ambulatory Visit: Payer: Self-pay

## 2023-09-11 ENCOUNTER — Ambulatory Visit (INDEPENDENT_AMBULATORY_CARE_PROVIDER_SITE_OTHER): Payer: Commercial Managed Care - PPO | Admitting: Medical-Surgical

## 2023-09-11 ENCOUNTER — Other Ambulatory Visit (HOSPITAL_COMMUNITY): Payer: Self-pay

## 2023-09-11 VITALS — BP 130/70 | HR 111 | Resp 20 | Ht 68.0 in | Wt 214.0 lb

## 2023-09-11 DIAGNOSIS — I10 Essential (primary) hypertension: Secondary | ICD-10-CM | POA: Diagnosis not present

## 2023-09-11 DIAGNOSIS — F909 Attention-deficit hyperactivity disorder, unspecified type: Secondary | ICD-10-CM | POA: Diagnosis not present

## 2023-09-11 DIAGNOSIS — Z6832 Body mass index (BMI) 32.0-32.9, adult: Secondary | ICD-10-CM

## 2023-09-11 DIAGNOSIS — G4733 Obstructive sleep apnea (adult) (pediatric): Secondary | ICD-10-CM | POA: Diagnosis not present

## 2023-09-11 DIAGNOSIS — Z23 Encounter for immunization: Secondary | ICD-10-CM | POA: Diagnosis not present

## 2023-09-11 DIAGNOSIS — Z0000 Encounter for general adult medical examination without abnormal findings: Secondary | ICD-10-CM

## 2023-09-11 DIAGNOSIS — E66811 Obesity, class 1: Secondary | ICD-10-CM | POA: Diagnosis not present

## 2023-09-11 DIAGNOSIS — E6609 Other obesity due to excess calories: Secondary | ICD-10-CM

## 2023-09-11 DIAGNOSIS — E782 Mixed hyperlipidemia: Secondary | ICD-10-CM | POA: Diagnosis not present

## 2023-09-11 MED ORDER — AMPHETAMINE-DEXTROAMPHETAMINE 20 MG PO TABS
20.0000 mg | ORAL_TABLET | Freq: Two times a day (BID) | ORAL | 0 refills | Status: DC
  Filled 2023-12-17: qty 60, 30d supply, fill #0

## 2023-09-11 MED ORDER — AMPHETAMINE-DEXTROAMPHETAMINE 20 MG PO TABS
20.0000 mg | ORAL_TABLET | Freq: Two times a day (BID) | ORAL | 0 refills | Status: DC
  Filled 2023-11-19: qty 60, 30d supply, fill #0

## 2023-09-11 MED ORDER — ZEPBOUND 2.5 MG/0.5ML ~~LOC~~ SOAJ
2.5000 mg | SUBCUTANEOUS | 0 refills | Status: DC
  Filled 2023-09-11: qty 2, 28d supply, fill #0

## 2023-09-11 MED ORDER — AMPHETAMINE-DEXTROAMPHETAMINE 20 MG PO TABS
20.0000 mg | ORAL_TABLET | Freq: Two times a day (BID) | ORAL | 0 refills | Status: DC
Start: 2023-09-11 — End: 2024-03-11
  Filled 2023-09-11: qty 60, 30d supply, fill #0

## 2023-09-11 NOTE — Patient Instructions (Signed)
Preventive Care 21-32 Years Old, Male Preventive care refers to lifestyle choices and visits with your health care provider that can promote health and wellness. Preventive care visits are also called wellness exams. What can I expect for my preventive care visit? Counseling During your preventive care visit, your health care provider may ask about your: Medical history, including: Past medical problems. Family medical history. Current health, including: Emotional well-being. Home life and relationship well-being. Sexual activity. Lifestyle, including: Alcohol, nicotine or tobacco, and drug use. Access to firearms. Diet, exercise, and sleep habits. Safety issues such as seatbelt and bike helmet use. Sunscreen use. Work and work environment. Physical exam Your health care provider may check your: Height and weight. These may be used to calculate your BMI (body mass index). BMI is a measurement that tells if you are at a healthy weight. Waist circumference. This measures the distance around your waistline. This measurement also tells if you are at a healthy weight and may help predict your risk of certain diseases, such as type 2 diabetes and high blood pressure. Heart rate and blood pressure. Body temperature. Skin for abnormal spots. What immunizations do I need?  Vaccines are usually given at various ages, according to a schedule. Your health care provider will recommend vaccines for you based on your age, medical history, and lifestyle or other factors, such as travel or where you work. What tests do I need? Screening Your health care provider may recommend screening tests for certain conditions. This may include: Lipid and cholesterol levels. Diabetes screening. This is done by checking your blood sugar (glucose) after you have not eaten for a while (fasting). Hepatitis B test. Hepatitis C test. HIV (human immunodeficiency virus) test. STI (sexually transmitted infection)  testing, if you are at risk. Talk with your health care provider about your test results, treatment options, and if necessary, the need for more tests. Follow these instructions at home: Eating and drinking  Eat a healthy diet that includes fresh fruits and vegetables, whole grains, lean protein, and low-fat dairy products. Drink enough fluid to keep your urine pale yellow. Take vitamin and mineral supplements as recommended by your health care provider. Do not drink alcohol if your health care provider tells you not to drink. If you drink alcohol: Limit how much you have to 0-2 drinks a day. Know how much alcohol is in your drink. In the [place] one drink equals one 12 oz bottle of beer (355 mL), one 5 oz glass of wine (148 mL), or one 1 oz glass of hard liquor (44 mL). Lifestyle Brush your teeth every morning and night with fluoride toothpaste. Floss one time each day. Exercise for at least 30 minutes 5 or more days each week. Do not use any products that contain nicotine or tobacco. These products include cigarettes, chewing tobacco, and vaping devices, such as e-cigarettes. If you need help quitting, ask your health care provider. Do not use drugs. If you are sexually active, practice safe sex. Use a condom or other form of protection to prevent STIs. Find healthy ways to manage stress, such as: Meditation, yoga, or listening to music. Journaling. Talking to a trusted person. Spending time with friends and family. Minimize exposure to UV radiation to reduce your risk of skin cancer. Safety Always wear your seat belt while driving or riding in a vehicle. Do not drive: If you have been drinking alcohol. Do not ride with someone who has been drinking. If you have been using any mind-altering substances   or drugs. While texting. When you are tired or distracted. Wear a helmet and other protective equipment during sports activities. If you have firearms in your house, make sure you  follow all gun safety procedures. Seek help if you have been physically or sexually abused. What's next? Go to your health care provider once a year for an annual wellness visit. Ask your health care provider how often you should have your eyes and teeth checked. Stay up to date on all vaccines. This information is not intended to replace advice given to you by your health care provider. Make sure you discuss any questions you have with your health care provider. Document Revised: 02/02/2021 Document Reviewed: 02/02/2021 Elsevier Patient Education  2024 [employer]  

## 2023-09-11 NOTE — Progress Notes (Signed)
Medical screening examination/treatment was performed by qualified clinical staff member and as supervising provider I was immediately available for consultation/collaboration. I have reviewed documentation and agree with assessment and plan. ° °Opie Maclaughlin L. Kreston Ahrendt, DNP, APRN, FNP-BC °[facility] °Primary Care and Sports Medicine ° °

## 2023-09-11 NOTE — Progress Notes (Signed)
Complete physical exam  Patient: Stephen Steele   DOB: 1991-06-21   32 y.o. Male  MRN: [id number]  Subjective:    Chief Complaint  Patient presents with   Annual Exam   Sleep Apnea    Stephen Steele is a 33 y.o. male who presents today for a complete physical exam. He reports consuming a general diet. The patient does not participate in regular exercise at present. He generally feels well. He reports sleeping fairly well. He does have additional problems to discuss today.    Most recent fall risk assessment:    12/21/2022    9:49 AM  Fall Risk   Falls in the past year? 0  Number falls in past yr: 0  Injury with Fall? 0  Risk for fall due to : No Fall Risks  Follow up Falls evaluation completed   Most recent depression screenings:    06/26/2023   10:54 AM 12/21/2022    9:49 AM  PHQ 2/9 Scores  PHQ - 2 Score 0 1  PHQ- 9 Score 3     Vision:Not within last year  and scheduled for Feb 2025  Patient Active Problem List   Diagnosis Date Noted   Acute maxillary sinusitis, unspecified 04/07/2022   Hypersomnolence 02/04/2019   OSA on CPAP 02/04/2019   Chronic bilateral low back pain without sciatica 02/04/2019   Pulmonary nodules 07/29/2018   Allergic rhinitis with postnasal drip 04/08/2018   Mixed hyperlipidemia 04/16/2017   Adult ADHD 04/13/2017   Class 1 obesity due to excess calories with serious comorbidity and body mass index (BMI) of 30.0 to 30.9 in adult 04/13/2017   Hypertension goal BP (blood pressure) < 130/80 04/13/2017      Patient Care Team: Christen Butter, NP as PCP - General (Nurse Practitioner)   Outpatient Medications Prior to Visit  Medication Sig   AMBULATORY NON FORMULARY MEDICATION Supply ordered: CPAP and other supplies needed (headgear, cushions, filters, heated tuubing and water chamber) Dx: obstructive sleep apnea Settings: auto-titration 5-20 cmH2O   atorvastatin (LIPITOR) 40 MG tablet Take 1 tablet (40 mg total) by mouth daily.   fluticasone  (FLONASE) 50 MCG/ACT nasal spray Place 1 spray into both nostrils daily.   lisinopril (ZESTRIL) 20 MG tablet Take 1 tablet (20 mg total) by mouth daily.   montelukast (SINGULAIR) 10 MG tablet Take 1 tablet (10 mg total) by mouth daily.   tiZANidine (ZANAFLEX) 4 MG tablet Take 1 tablet by mouth at bedtime as needed for muscle spasms.   [DISCONTINUED] amphetamine-dextroamphetamine (ADDERALL) 20 MG tablet Take 1 tablet (20 mg total) by mouth 2 (two) times daily.   [DISCONTINUED] amphetamine-dextroamphetamine (ADDERALL) 20 MG tablet Take 1 tablet (20 mg total) by mouth 2 (two) times daily.   [DISCONTINUED] amphetamine-dextroamphetamine (ADDERALL) 20 MG tablet Take 1 tablet (20 mg) by mouth 2 times daily.   No facility-administered medications prior to visit.    Review of Systems  Constitutional: Negative.   HENT:  Positive for congestion.        Turbinates are visibly inflamed   Eyes:        Wears glasses (stigmatism)  Respiratory: Negative.    Cardiovascular: Negative.   Gastrointestinal: Negative.   Genitourinary: Negative.   Musculoskeletal:  Positive for back pain (Chronci back pain managed with acetaminophen and zanaflex).  Skin: Negative.   Neurological: Negative.   Endo/Heme/Allergies: Negative.   Psychiatric/Behavioral: Negative.        Objective:     BP 130/70 (BP Location: Left Arm,  Cuff Size: Normal)   Pulse (!) 111   Resp 20   Ht 5\' 8"  (1.727 m)   Wt 214 lb (97.1 kg)   SpO2 99%   BMI 32.54 kg/m  BP Readings from Last 3 Encounters:  09/11/23 130/70  07/05/23 122/77  06/26/23 111/68      Physical Exam Constitutional:      Appearance: He is obese.  HENT:     Head: Normocephalic.     Jaw: There is normal jaw occlusion.     Nose: Nose normal.     Mouth/Throat:     Mouth: Mucous membranes are moist.     Pharynx: Oropharynx is clear.  Eyes:     General: Lids are normal. Lids are everted, no foreign bodies appreciated. Vision grossly intact. Gaze aligned  appropriately.     Extraocular Movements: Extraocular movements intact.     Conjunctiva/sclera: Conjunctivae normal.     Pupils: Pupils are equal, round, and reactive to light.  Neck:     Trachea: Trachea and phonation normal.  Cardiovascular:     Rate and Rhythm: Normal rate and regular rhythm.     Pulses:          Radial pulses are 2+ on the right side and 2+ on the left side.       Posterior tibial pulses are 1+ on the right side and 1+ on the left side.     Heart sounds: Normal heart sounds, S1 normal and S2 normal.  Pulmonary:     Effort: Pulmonary effort is normal.     Breath sounds: Normal breath sounds and air entry.  Abdominal:     General: Abdomen is protuberant. Bowel sounds are normal.  Musculoskeletal:     Cervical back: Full passive range of motion without pain, normal range of motion and neck supple.     Right lower leg: No edema.     Left lower leg: No edema.  Skin:    General: Skin is warm and dry.  Neurological:     General: No focal deficit present.     Mental Status: He is alert.     Cranial Nerves: Cranial nerves 2-12 are intact.     Sensory: Sensation is intact.     Motor: Motor function is intact.     Coordination: Coordination is intact.     Gait: Gait is intact.     Deep Tendon Reflexes: Reflexes are normal and symmetric.     Reflex Scores:      Tricep reflexes are 2+ on the right side and 2+ on the left side.      Bicep reflexes are 2+ on the right side and 2+ on the left side.      Patellar reflexes are 2+ on the right side and 2+ on the left side. Psychiatric:        Attention and Perception: Attention and perception normal.        Mood and Affect: Mood and affect normal.        Speech: Speech normal.        Behavior: Behavior normal. Behavior is cooperative.        Thought Content: Thought content normal.        Cognition and Memory: Cognition and memory normal.        Judgment: Judgment normal.     No results found for any visits on  09/11/23. Last CBC Lab Results  Component Value Date   WBC 3.8 07/18/2022   HGB  14.9 07/18/2022   HCT 44.4 07/18/2022   MCV 90.8 07/18/2022   MCH 30.5 07/18/2022   RDW 12.5 07/18/2022   PLT 275 07/18/2022   Last metabolic panel Lab Results  Component Value Date   GLUCOSE 85 06/26/2023   NA 138 06/26/2023   K 4.4 06/26/2023   CL 101 06/26/2023   CO2 23 06/26/2023   BUN 17 06/26/2023   CREATININE 1.06 06/26/2023   EGFR 96 06/26/2023   CALCIUM 9.7 06/26/2023   PROT 6.9 06/26/2023   ALBUMIN 4.8 06/26/2023   LABGLOB 2.1 06/26/2023   BILITOT 0.4 06/26/2023   ALKPHOS 105 06/26/2023   AST 28 06/26/2023   ALT 55 (H) 06/26/2023   Last lipids Lab Results  Component Value Date   CHOL 146 06/26/2023   HDL 30 (L) 06/26/2023   LDLCALC 90 06/26/2023   TRIG 143 06/26/2023   CHOLHDL 4.9 06/26/2023   Last hemoglobin A1c Lab Results  Component Value Date   HGBA1C 5.5 07/18/2022   Last thyroid functions Lab Results  Component Value Date   TSH 1.39 07/18/2022   Last vitamin D No results found for: "25OHVITD2", "25OHVITD3", "VD25OH" Last vitamin B12 and Folate No results found for: "VITAMINB12", "FOLATE"      Assessment & Plan:    Routine Health Maintenance and Physical Exam  Immunization History  Administered Date(s) Administered   DTaP 11/21/1991, 01/23/1992, 04/02/1992, 01/28/1993, 12/15/1996   HIB (PRP-OMP) 11/21/1991, 01/23/1992, 04/02/1992, 01/28/1993   Hepatitis A, Ped/Adol-2 Dose 04/23/2009   Hepatitis B, PED/ADOLESCENT 11/21/1991, 01/23/1992, 10/15/1992   IPV 11/21/1991, 01/23/1992, 01/28/1993, 12/15/1996   Influenza,inj,Quad PF,6+ Mos 05/02/2021   Influenza,trivalent, recombinat, inj, PF 05/28/2016   Influenza-Unspecified 05/19/2019, 05/11/2022, 05/12/2023   MMR 01/28/1993, 12/15/1996   Meningococcal Conjugate 04/23/2009   PFIZER(Purple Top)SARS-COV-2 Vaccination 08/11/2019, 09/01/2019   Td 04/23/2009   Tdap 04/23/2009, 09/11/2023    Health Maintenance   Topic Date Due   COVID-19 Vaccine (3 - 2024-25 season) 09/27/2023 (Originally 04/22/2023)   DTaP/Tdap/Td (9 - Td or Tdap) 09/10/2033   INFLUENZA VACCINE  Completed   Hepatitis C Screening  Completed   HIV Screening  Completed   HPV VACCINES  Aged Out    Discussed health benefits of physical activity, and encouraged him to engage in regular exercise appropriate for his age and condition.   1. OSA on CPAP (Primary) Pt states he has been using CPAP every night for several years  and that his wife says he snores less. Body mass index is 32.54 kg/m.  Patient is very interested in starting zepbound. Co-morbidities would benefit from weight loss with zepbound.  Start tirzepatide (ZEPBOUND) 2.5 MG/0.5ML Pen; Inject 2.5 mg into the skin once a week.  2. Hypertension goal BP (blood pressure) < 130/80 Pt states that he takes his medication daily as prescribed Denies CP paplpatation, HA, vision changes, lower extremity edema, or any S&S associated with HTN. Htn is well manged on current regimen. Continue lisinopril 20mg  PO daily.  Pt HTN would benefit with weight loss for tirzepitide.  Start tirzepatide (ZEPBOUND) 2.5 MG/0.5ML Pen; Inject 2.5 mg into the skin once a week.  3. Adult ADHD Pt takes amphetamine-dextroamphetamine as directed daily Pt states that his focus and attention span are much better when he takes amphetamine-dextroamphetamine. Pt denies anxiety tremors panic attacks or agitation from medication. Pt not interested in changing medications ADHD is well managed on current regimen Continue and refill amphetamine-dextroamphetamine (ADDERALL) 20mg  PO BID for 90 day supply (240 tablets)  4. Mixed hyperlipidemia  Pt has elevated LDL and low HDL and is taking atorvastattin 40mg  PO daily. Pt states he is eating healthy eating more vegetables and less red meat. Pt has hyperlipidemia     Component Value Date/Time   CHOL 146 06/26/2023 1105   TRIG 143 06/26/2023 1105   HDL 30 (L) 06/26/2023  1105   CHOLHDL 4.9 06/26/2023 1105   CHOLHDL 5.0 (H) 07/18/2022 0845   LDLCALC 90 06/26/2023 1105   LDLCALC 122 (H) 07/18/2022 0845   LABVLDL 26 06/26/2023 1105  Pt cholesterol would benefit from weight loss from tirzepetide Start tirzepatide (ZEPBOUND) 2.5 MG/0.5ML Pen; Inject 2.5 mg into the skin once a week.  5. Annual physical exam - CBC with Differential/Platelet - CMP14+EGFR  6. Class 1 obesity due to excess calories with serious comorbidity and body mass index (BMI) of 32.0 to 32.9 in adult Body mass index is 32.54 kg/m. Pts BMI would improve with weight loss from tirzepatide. Start tirzepatide (ZEPBOUND) 2.5 MG/0.5ML Pen; Inject 2.5 mg into the skin once a week.    Return in about 6 months (around 03/10/2024) for ADHD/HTN follow up.     Maryelizabeth Kaufmann Student AGNP

## 2023-09-11 NOTE — Progress Notes (Deleted)
Complete physical exam  Patient: Stephen Steele   DOB: 1991/03/18   32 y.o. Male  MRN: [id number]  Subjective:    Chief Complaint  Patient presents with   Annual Exam   Sleep Apnea    Stephen Steele is a 33 y.o. male who presents today for a complete physical exam. He reports consuming a {diet types:17450} diet. {types:19826} He generally feels {DESC; WELL/FAIRLY WELL/POORLY:18703}. He reports sleeping {DESC; WELL/FAIRLY WELL/POORLY:18703}. He {does/does not:200015} have additional problems to discuss today.   Most recent fall risk assessment:    12/21/2022    9:49 AM  Fall Risk   Falls in the past year? 0  Number falls in past yr: 0  Injury with Fall? 0  Risk for fall due to : No Fall Risks  Follow up Falls evaluation completed     Most recent depression screenings:    06/26/2023   10:54 AM 12/21/2022    9:49 AM  PHQ 2/9 Scores  PHQ - 2 Score 0 1  PHQ- 9 Score 3     {VISON DENTAL STD PSA (Optional):27386}  {History (Optional):23778}  Patient Care Team: Christen Butter, NP as PCP - General (Nurse Practitioner)   Outpatient Medications Prior to Visit  Medication Sig   AMBULATORY NON FORMULARY MEDICATION Supply ordered: CPAP and other supplies needed (headgear, cushions, filters, heated tuubing and water chamber) Dx: obstructive sleep apnea Settings: auto-titration 5-20 cmH2O   amphetamine-dextroamphetamine (ADDERALL) 20 MG tablet Take 1 tablet (20 mg total) by mouth 2 (two) times daily.   amphetamine-dextroamphetamine (ADDERALL) 20 MG tablet Take 1 tablet (20 mg total) by mouth 2 (two) times daily.   amphetamine-dextroamphetamine (ADDERALL) 20 MG tablet Take 1 tablet (20 mg) by mouth 2 times daily.   atorvastatin (LIPITOR) 40 MG tablet Take 1 tablet (40 mg total) by mouth daily.   fluticasone (FLONASE) 50 MCG/ACT nasal spray Place 1 spray into both nostrils daily.   lisinopril (ZESTRIL) 20 MG tablet Take 1 tablet (20 mg total) by mouth daily.   montelukast (SINGULAIR) 10  MG tablet Take 1 tablet (10 mg total) by mouth daily.   tiZANidine (ZANAFLEX) 4 MG tablet Take 1 tablet by mouth at bedtime as needed for muscle spasms.   No facility-administered medications prior to visit.    ROS        Objective:     BP 130/70 (BP Location: Left Arm, Cuff Size: Normal)   Pulse (!) 111   Resp 20   Ht 5\' 8"  (1.727 m)   Wt 214 lb (97.1 kg)   SpO2 99%   BMI 32.54 kg/m  {Vitals History (Optional):23777}  Physical Exam   No results found for any visits on 09/11/23. {Show previous labs (optional):23779}    Assessment & Plan:    Routine Health Maintenance and Physical Exam  Immunization History  Administered Date(s) Administered   Influenza,inj,Quad PF,6+ Mos 05/02/2021   Influenza,trivalent, recombinat, inj, PF 05/28/2016   Influenza-Unspecified 05/19/2019, 05/11/2022   PFIZER(Purple Top)SARS-COV-2 Vaccination 08/11/2019, 09/01/2019    Health Maintenance  Topic Date Due   DTaP/Tdap/Td (1 - Tdap) Never done   COVID-19 Vaccine (3 - 2024-25 season) 09/27/2023 (Originally 04/22/2023)   INFLUENZA VACCINE  11/19/2023 (Originally 03/22/2023)   Hepatitis C Screening  Completed   HIV Screening  Completed   HPV VACCINES  Aged Out    Discussed health benefits of physical activity, and encouraged him to engage in regular exercise appropriate for his age and condition.  Problem List Items Addressed This Visit  Adult ADHD   Hypertension goal BP (blood pressure) < 130/80   Mixed hyperlipidemia   OSA on CPAP - Primary   Other Visit Diagnoses       Annual physical exam          No follow-ups on file.     Christen Butter, NP

## 2023-09-12 ENCOUNTER — Encounter: Payer: Self-pay | Admitting: Medical-Surgical

## 2023-09-12 ENCOUNTER — Other Ambulatory Visit (HOSPITAL_COMMUNITY): Payer: Self-pay

## 2023-09-12 LAB — CBC WITH DIFFERENTIAL/PLATELET
Basophils Absolute: 0 10*3/uL (ref 0.0–0.2)
Basos: 0 %
EOS (ABSOLUTE): 0 10*3/uL (ref 0.0–0.4)
Eos: 1 %
Hematocrit: 42.3 % (ref 37.5–51.0)
Hemoglobin: 14.2 g/dL (ref 13.0–17.7)
Immature Grans (Abs): 0 10*3/uL (ref 0.0–0.1)
Immature Granulocytes: 0 %
Lymphocytes Absolute: 0.9 10*3/uL (ref 0.7–3.1)
Lymphs: 19 %
MCH: 30.7 pg (ref 26.6–33.0)
MCHC: 33.6 g/dL (ref 31.5–35.7)
MCV: 91 fL (ref 79–97)
Monocytes Absolute: 0.6 10*3/uL (ref 0.1–0.9)
Monocytes: 13 %
Neutrophils Absolute: 3 10*3/uL (ref 1.4–7.0)
Neutrophils: 67 %
Platelets: 227 10*3/uL (ref 150–450)
RBC: 4.63 x10E6/uL (ref 4.14–5.80)
RDW: 12.3 % (ref 11.6–15.4)
WBC: 4.6 10*3/uL (ref 3.4–10.8)

## 2023-09-12 LAB — CMP14+EGFR
ALT: 39 [IU]/L (ref 0–44)
AST: 24 [IU]/L (ref 0–40)
Albumin: 4.7 g/dL (ref 4.1–5.1)
Alkaline Phosphatase: 82 [IU]/L (ref 44–121)
BUN/Creatinine Ratio: 16 (ref 9–20)
BUN: 16 mg/dL (ref 6–20)
Bilirubin Total: 0.3 mg/dL (ref 0.0–1.2)
CO2: 23 mmol/L (ref 20–29)
Calcium: 9.7 mg/dL (ref 8.7–10.2)
Chloride: 101 mmol/L (ref 96–106)
Creatinine, Ser: 0.98 mg/dL (ref 0.76–1.27)
Globulin, Total: 1.9 g/dL (ref 1.5–4.5)
Glucose: 103 mg/dL — ABNORMAL HIGH (ref 70–99)
Potassium: 4.9 mmol/L (ref 3.5–5.2)
Sodium: 139 mmol/L (ref 134–144)
Total Protein: 6.6 g/dL (ref 6.0–8.5)
eGFR: 106 mL/min/{1.73_m2} (ref >59)

## 2023-09-18 ENCOUNTER — Other Ambulatory Visit: Payer: Self-pay

## 2023-09-18 ENCOUNTER — Other Ambulatory Visit (HOSPITAL_COMMUNITY): Payer: Self-pay

## 2023-09-19 ENCOUNTER — Other Ambulatory Visit (HOSPITAL_COMMUNITY): Payer: Self-pay

## 2023-09-19 ENCOUNTER — Encounter: Payer: Self-pay | Admitting: Medical-Surgical

## 2023-09-20 ENCOUNTER — Telehealth: Payer: Self-pay

## 2023-09-20 NOTE — Telephone Encounter (Signed)
Prior auth for: ZEPBOUND 2.5 Determination: DENIED Reason: This drug/product is not covered under the pharmacy benefit. Plan exclusion Auth #: [id number] Patient notified via Mychart

## 2023-09-22 DIAGNOSIS — H5713 Ocular pain, bilateral: Secondary | ICD-10-CM | POA: Diagnosis not present

## 2023-09-22 DIAGNOSIS — H5712 Ocular pain, left eye: Secondary | ICD-10-CM | POA: Insufficient documentation

## 2023-09-22 DIAGNOSIS — H1033 Unspecified acute conjunctivitis, bilateral: Secondary | ICD-10-CM | POA: Diagnosis not present

## 2023-09-24 DIAGNOSIS — H168 Other keratitis: Secondary | ICD-10-CM | POA: Diagnosis not present

## 2023-09-25 DIAGNOSIS — H168 Other keratitis: Secondary | ICD-10-CM | POA: Diagnosis not present

## 2023-10-02 DIAGNOSIS — B0052 Herpesviral keratitis: Secondary | ICD-10-CM | POA: Diagnosis not present

## 2023-10-05 DIAGNOSIS — H18893 Other specified disorders of cornea, bilateral: Secondary | ICD-10-CM | POA: Diagnosis not present

## 2023-10-05 DIAGNOSIS — S0502XD Injury of conjunctiva and corneal abrasion without foreign body, left eye, subsequent encounter: Secondary | ICD-10-CM | POA: Diagnosis not present

## 2023-10-12 DIAGNOSIS — H18893 Other specified disorders of cornea, bilateral: Secondary | ICD-10-CM | POA: Diagnosis not present

## 2023-10-12 DIAGNOSIS — S0502XD Injury of conjunctiva and corneal abrasion without foreign body, left eye, subsequent encounter: Secondary | ICD-10-CM | POA: Diagnosis not present

## 2023-10-23 DIAGNOSIS — H18893 Other specified disorders of cornea, bilateral: Secondary | ICD-10-CM | POA: Diagnosis not present

## 2023-10-23 DIAGNOSIS — S0502XD Injury of conjunctiva and corneal abrasion without foreign body, left eye, subsequent encounter: Secondary | ICD-10-CM | POA: Diagnosis not present

## 2023-11-06 DIAGNOSIS — G4733 Obstructive sleep apnea (adult) (pediatric): Secondary | ICD-10-CM | POA: Diagnosis not present

## 2023-11-07 DIAGNOSIS — H18893 Other specified disorders of cornea, bilateral: Secondary | ICD-10-CM | POA: Insufficient documentation

## 2023-11-19 ENCOUNTER — Other Ambulatory Visit: Payer: Self-pay

## 2023-11-19 ENCOUNTER — Other Ambulatory Visit (HOSPITAL_COMMUNITY): Payer: Self-pay

## 2023-12-10 ENCOUNTER — Other Ambulatory Visit (HOSPITAL_COMMUNITY)
Admission: RE | Admit: 2023-12-10 | Discharge: 2023-12-10 | Disposition: A | Discharge status: Home / Self Care | Payer: Self-pay | Source: Ambulatory Visit | Attending: Medical Genetics | Admitting: Medical Genetics

## 2023-12-10 DIAGNOSIS — Z006 Encounter for examination for normal comparison and control in clinical research program: Secondary | ICD-10-CM | POA: Insufficient documentation

## 2023-12-17 ENCOUNTER — Other Ambulatory Visit (HOSPITAL_COMMUNITY): Payer: Self-pay

## 2023-12-17 ENCOUNTER — Other Ambulatory Visit: Payer: Self-pay

## 2023-12-18 LAB — GENECONNECT MOLECULAR SCREEN: Genetic Analysis Overall Interpretation: NEGATIVE

## 2023-12-25 ENCOUNTER — Ambulatory Visit: Payer: 59 | Admitting: Medical-Surgical

## 2023-12-26 DIAGNOSIS — H16103 Unspecified superficial keratitis, bilateral: Secondary | ICD-10-CM | POA: Diagnosis not present

## 2024-02-04 ENCOUNTER — Other Ambulatory Visit: Payer: Self-pay | Admitting: Medical-Surgical

## 2024-02-04 DIAGNOSIS — F909 Attention-deficit hyperactivity disorder, unspecified type: Secondary | ICD-10-CM

## 2024-02-04 NOTE — Telephone Encounter (Signed)
 Last office visit 09/11/2023  Upcoming appointment 03/11/2024  Last filled 11/10/2023

## 2024-02-07 ENCOUNTER — Other Ambulatory Visit: Payer: Self-pay

## 2024-02-07 ENCOUNTER — Other Ambulatory Visit (HOSPITAL_COMMUNITY): Payer: Self-pay

## 2024-02-07 ENCOUNTER — Encounter: Payer: Self-pay | Admitting: Medical-Surgical

## 2024-02-07 DIAGNOSIS — F909 Attention-deficit hyperactivity disorder, unspecified type: Secondary | ICD-10-CM

## 2024-02-07 MED ORDER — TACROLIMUS 0.03 % EX OINT
1.0000 | TOPICAL_OINTMENT | Freq: Two times a day (BID) | CUTANEOUS | 3 refills | Status: AC
  Filled 2024-02-07: qty 30, 30d supply, fill #0
  Filled 2024-02-07: qty 30, 15d supply, fill #0
  Filled 2024-07-07: qty 30, 30d supply, fill #1

## 2024-02-07 MED ORDER — MOXIFLOXACIN HCL 0.5 % OP SOLN
1.0000 [drp] | OPHTHALMIC | 1 refills | Status: DC
  Filled 2024-02-07: qty 3, 5d supply, fill #0
  Filled 2024-05-05: qty 3, 5d supply, fill #1

## 2024-02-08 ENCOUNTER — Other Ambulatory Visit (HOSPITAL_COMMUNITY): Payer: Self-pay

## 2024-02-08 MED ORDER — AMPHETAMINE-DEXTROAMPHETAMINE 20 MG PO TABS: 20.0000 mg | ORAL_TABLET | Freq: Two times a day (BID) | ORAL | 0 refills | Status: DC

## 2024-02-08 MED ORDER — AMPHETAMINE-DEXTROAMPHETAMINE 20 MG PO TABS
20.0000 mg | ORAL_TABLET | Freq: Two times a day (BID) | ORAL | 0 refills | Status: DC
  Filled 2024-02-08 – 2024-02-15 (×2): qty 60, 30d supply, fill #0

## 2024-02-08 NOTE — Addendum Note (Signed)
 Addended by: Doretha Ganja on: 02/08/2024 01:27 PM   Modules accepted: Orders

## 2024-02-08 NOTE — Addendum Note (Signed)
 Addended by: Abundio Hoit on: 02/08/2024 01:33 PM   Modules accepted: Orders

## 2024-02-15 ENCOUNTER — Other Ambulatory Visit (HOSPITAL_COMMUNITY): Payer: Self-pay

## 2024-03-11 ENCOUNTER — Other Ambulatory Visit (HOSPITAL_COMMUNITY): Payer: Self-pay

## 2024-03-11 ENCOUNTER — Encounter: Payer: Self-pay | Admitting: Medical-Surgical

## 2024-03-11 ENCOUNTER — Ambulatory Visit: Payer: Commercial Managed Care - PPO | Admitting: Medical-Surgical

## 2024-03-11 VITALS — BP 123/72 | HR 116 | Resp 20 | Ht 68.0 in | Wt 202.0 lb

## 2024-03-11 DIAGNOSIS — G8929 Other chronic pain: Secondary | ICD-10-CM

## 2024-03-11 DIAGNOSIS — E782 Mixed hyperlipidemia: Secondary | ICD-10-CM

## 2024-03-11 DIAGNOSIS — M545 Low back pain, unspecified: Secondary | ICD-10-CM | POA: Diagnosis not present

## 2024-03-11 DIAGNOSIS — G4733 Obstructive sleep apnea (adult) (pediatric): Secondary | ICD-10-CM

## 2024-03-11 DIAGNOSIS — F909 Attention-deficit hyperactivity disorder, unspecified type: Secondary | ICD-10-CM | POA: Diagnosis not present

## 2024-03-11 DIAGNOSIS — R0982 Postnasal drip: Secondary | ICD-10-CM | POA: Diagnosis not present

## 2024-03-11 DIAGNOSIS — J309 Allergic rhinitis, unspecified: Secondary | ICD-10-CM

## 2024-03-11 DIAGNOSIS — I10 Essential (primary) hypertension: Secondary | ICD-10-CM | POA: Diagnosis not present

## 2024-03-11 MED ORDER — AMPHETAMINE-DEXTROAMPHETAMINE 20 MG PO TABS
20.0000 mg | ORAL_TABLET | Freq: Two times a day (BID) | ORAL | 0 refills | Status: DC
  Filled 2024-03-11 – 2024-04-08 (×2): qty 60, 30d supply, fill #0

## 2024-03-11 MED ORDER — AMPHETAMINE-DEXTROAMPHETAMINE 20 MG PO TABS
20.0000 mg | ORAL_TABLET | Freq: Two times a day (BID) | ORAL | 0 refills | Status: DC
  Filled 2024-07-07: qty 60, 30d supply, fill #0

## 2024-03-11 MED ORDER — LISINOPRIL 20 MG PO TABS
20.0000 mg | ORAL_TABLET | Freq: Every day | ORAL | 3 refills | Status: AC
  Filled 2024-03-11: qty 90, 90d supply, fill #0
  Filled 2024-06-10: qty 90, 90d supply, fill #1
  Filled 2024-09-10: qty 90, 90d supply, fill #2

## 2024-03-11 MED ORDER — AMPHETAMINE-DEXTROAMPHETAMINE 20 MG PO TABS
20.0000 mg | ORAL_TABLET | Freq: Two times a day (BID) | ORAL | 0 refills | Status: DC
  Filled 2024-05-28: qty 60, 30d supply, fill #0

## 2024-03-11 MED ORDER — TIZANIDINE HCL 4 MG PO TABS
ORAL_TABLET | ORAL | 3 refills | Status: AC
  Filled 2024-03-11: qty 90, fill #0
  Filled 2024-04-08: qty 90, 90d supply, fill #0
  Filled 2024-07-07: qty 90, 90d supply, fill #1

## 2024-03-11 MED ORDER — MONTELUKAST SODIUM 10 MG PO TABS
10.0000 mg | ORAL_TABLET | Freq: Every day | ORAL | 3 refills | Status: AC
  Filled 2024-03-11 – 2024-06-10 (×2): qty 90, 90d supply, fill #0
  Filled 2024-09-10: qty 90, 90d supply, fill #1

## 2024-03-11 MED ORDER — ATORVASTATIN CALCIUM 40 MG PO TABS
40.0000 mg | ORAL_TABLET | Freq: Every day | ORAL | 3 refills | Status: AC
  Filled 2024-03-11: qty 90, 90d supply, fill #0
  Filled 2024-06-10: qty 90, 90d supply, fill #1
  Filled 2024-09-10: qty 90, 90d supply, fill #2

## 2024-03-11 NOTE — Progress Notes (Addendum)
        Established patient visit  Discussed the use of AI scribe software for clinical note transcription with the patient, who gave verbal consent to proceed.  History of Present Illness   Stephen Steele is a 33 year old male with hypertension and ADHD who presents for a follow-up visit.  Hypertension - Blood pressure is 123/72 mmHg on current regimen. - Managed with lisinopril  20 mg daily. - No issues or side effects with antihypertensive therapy.  Attention deficit hyperactivity disorder (adhd) and stimulant use - Takes Adderall twice daily for ADHD. - Occasionally skips doses on weekends. - Experiences elevated heart rate in the mornings when taking medication with an energy drink. - Heart rate stabilizes to 80s-90s bpm later in the day.  Obstructive sleep apnea - Uses CPAP machine for sleep apnea. - Has a surplus of CPAP supplies.  Medication tolerance - Takes tizanidine  as needed, Singulair , and Lipitor. - No significant side effects from these medications.      Physical Exam Vitals and nursing note reviewed.  Constitutional:      General: He is not in acute distress.    Appearance: Normal appearance.  HENT:     Head: Normocephalic and atraumatic.  Cardiovascular:     Rate and Rhythm: Normal rate and regular rhythm.     Pulses: Normal pulses.  Pulmonary:     Effort: Pulmonary effort is normal. No respiratory distress.  Skin:    General: Skin is warm and dry.  Neurological:     Mental Status: He is alert and oriented to person, place, and time.  Psychiatric:        Mood and Affect: Mood normal.        Behavior: Behavior normal.        Thought Content: Thought content normal.        Judgment: Judgment normal.    Assessment and Plan    Attention-Deficit/Hyperactivity Disorder (ADHD) ADHD symptoms well-managed with Adderall. No significant side effects, but increased heart rate with energy drinks. - Continue Adderall as prescribed.  Hypertension Blood  pressure well-controlled on lisinopril . - Continue lisinopril  20 mg daily.  Obstructive Sleep Apnea Satisfaction with CPAP therapy and supplies. - Continue CPAP therapy.     Medication Management Current treatment with Singulair , Lipitor, and Tizanidine  working well without side effects or concerns. Feels the medications remain helpful. - Singulair , Lipitor, and Tizanidine  refilled.  Return in about 6 months (around 09/11/2024) for HTN/ADHD follow up w/labs.  __________________________________ Zada FREDRIK Palin, DNP, APRN, FNP-BC Primary Care and Sports Medicine [facility]

## 2024-03-12 ENCOUNTER — Other Ambulatory Visit (HOSPITAL_COMMUNITY): Payer: Self-pay

## 2024-04-08 ENCOUNTER — Other Ambulatory Visit (HOSPITAL_COMMUNITY): Payer: Self-pay

## 2024-04-09 ENCOUNTER — Encounter: Payer: Self-pay | Admitting: Medical-Surgical

## 2024-04-22 ENCOUNTER — Telehealth: Admitting: Medical-Surgical

## 2024-04-22 ENCOUNTER — Encounter: Payer: Self-pay | Admitting: Medical-Surgical

## 2024-04-22 DIAGNOSIS — F411 Generalized anxiety disorder: Secondary | ICD-10-CM | POA: Diagnosis not present

## 2024-04-22 DIAGNOSIS — F322 Major depressive disorder, single episode, severe without psychotic features: Secondary | ICD-10-CM | POA: Diagnosis not present

## 2024-04-22 NOTE — Progress Notes (Signed)
 Virtual Visit via Video Note  I connected with Stephen Steele on 04/22/24 at  1:40 PM EDT by a video enabled telemedicine application and verified that I am speaking with the correct person using two identifiers.   I discussed the limitations of evaluation and management by telemedicine and the availability of in person appointments. The patient expressed understanding and agreed to proceed.  Patient location: home Provider locations: office  Subjective:    Discussed the use of AI scribe software for clinical note transcription with the patient, who gave verbal consent to proceed.  History of Present Illness   Stephen Steele is a 33 year old male who presents with stress and depressive symptoms.  Depressive and anhedonic symptoms - Intermittent but persistent depressive episodes - Anhedonia present - Irritability with wife and child - No current use of medication for depressive symptoms - Family members (wife, mother, sister) have had positive experiences with Lexapro   Stress and psychosocial stressors - Significant financial strain due to debt - Increased responsibilities at home related to wife's recent struggles and medication adjustments - Financial stress limits ability to pursue counseling  Passive suicidal ideation - Passive thoughts of self-harm - No active plans or intentions     Past medical history, Surgical history, Family history not pertinant except as noted below, Social history, Allergies, and medications have been entered into the medical record, reviewed, and corrections made.   Review of Systems: See HPI for pertinent positives and negatives.   Objective:    General: Speaking clearly in complete sentences without any shortness of breath.  Alert and oriented x3.  Normal judgment. No apparent acute distress.  Impression and Recommendations:    Assessment and Plan    Major depressive disorder and anxiety symptoms Stress from financial issues causing  depressive episodes and lack of interest. Passive self-harm thoughts without plan. Anxiety may lead to OCD tendencies. No prior medication. Escitalopram  chosen due to family history of positive response and fewer side effects in males. - Prescribe escitalopram  5 mg daily for one week, then 10 mg daily. - Follow up via MyChart or phone in 4-6 weeks to assess efficacy and side effects. - Advise to contact if symptoms worsen or side effects occur. - Discussed counseling; noted financial constraints.    I discussed the assessment and treatment plan with the patient. The patient was provided an opportunity to ask questions and all were answered. The patient agreed with the plan and demonstrated an understanding of the instructions.   The patient was advised to call back or seek an in-person evaluation if the symptoms worsen or if the condition fails to improve as anticipated.  Return for mood follow up via Mychart message/phone call in 4-6 weeks.  Zada FREDRIK Palin, DNP, APRN, FNP-BC [facility] and Sports Medicine

## 2024-04-23 ENCOUNTER — Other Ambulatory Visit (HOSPITAL_COMMUNITY): Payer: Self-pay

## 2024-04-23 MED ORDER — ESCITALOPRAM OXALATE 10 MG PO TABS
ORAL_TABLET | ORAL | 1 refills | Status: DC
  Filled 2024-04-23: qty 30, 30d supply, fill #0
  Filled 2024-05-24: qty 30, 30d supply, fill #1

## 2024-05-22 DIAGNOSIS — H18893 Other specified disorders of cornea, bilateral: Secondary | ICD-10-CM | POA: Diagnosis not present

## 2024-05-22 DIAGNOSIS — H04129 Dry eye syndrome of unspecified lacrimal gland: Secondary | ICD-10-CM | POA: Diagnosis not present

## 2024-05-23 DIAGNOSIS — H18893 Other specified disorders of cornea, bilateral: Secondary | ICD-10-CM | POA: Diagnosis not present

## 2024-05-24 ENCOUNTER — Other Ambulatory Visit (HOSPITAL_COMMUNITY): Payer: Self-pay

## 2024-05-26 ENCOUNTER — Other Ambulatory Visit: Payer: Self-pay

## 2024-05-26 ENCOUNTER — Other Ambulatory Visit (HOSPITAL_COMMUNITY): Payer: Self-pay

## 2024-05-26 MED ORDER — MOXIFLOXACIN HCL 0.5 % OP SOLN
1.0000 [drp] | Freq: Four times a day (QID) | OPHTHALMIC | 1 refills | Status: DC
  Filled 2024-05-26: qty 3, 15d supply, fill #0
  Filled 2024-06-10: qty 3, 15d supply, fill #1

## 2024-05-27 ENCOUNTER — Other Ambulatory Visit (HOSPITAL_COMMUNITY): Payer: Self-pay

## 2024-05-27 DIAGNOSIS — L209 Atopic dermatitis, unspecified: Secondary | ICD-10-CM | POA: Diagnosis not present

## 2024-05-27 DIAGNOSIS — H18893 Other specified disorders of cornea, bilateral: Secondary | ICD-10-CM | POA: Diagnosis not present

## 2024-05-28 ENCOUNTER — Other Ambulatory Visit: Payer: Self-pay

## 2024-05-28 ENCOUNTER — Other Ambulatory Visit (HOSPITAL_BASED_OUTPATIENT_CLINIC_OR_DEPARTMENT_OTHER): Payer: Self-pay

## 2024-05-28 ENCOUNTER — Other Ambulatory Visit (HOSPITAL_COMMUNITY): Payer: Self-pay

## 2024-05-28 MED ORDER — COMIRNATY 30 MCG/0.3ML IM SUSY
0.3000 mL | PREFILLED_SYRINGE | Freq: Once | INTRAMUSCULAR | 0 refills | Status: AC
  Filled 2024-05-28: qty 0.3, 1d supply, fill #0

## 2024-06-10 ENCOUNTER — Other Ambulatory Visit (HOSPITAL_COMMUNITY): Payer: Self-pay

## 2024-06-10 ENCOUNTER — Other Ambulatory Visit: Payer: Self-pay

## 2024-06-19 ENCOUNTER — Other Ambulatory Visit (HOSPITAL_COMMUNITY): Payer: Self-pay

## 2024-06-23 ENCOUNTER — Other Ambulatory Visit: Payer: Self-pay | Admitting: Medical-Surgical

## 2024-06-27 ENCOUNTER — Other Ambulatory Visit (HOSPITAL_COMMUNITY): Payer: Self-pay

## 2024-06-27 MED ORDER — ESCITALOPRAM OXALATE 10 MG PO TABS
ORAL_TABLET | ORAL | 1 refills | Status: DC
  Filled 2024-06-27: qty 30, 30d supply, fill #0
  Filled 2024-07-31: qty 30, 30d supply, fill #1

## 2024-06-30 ENCOUNTER — Other Ambulatory Visit (HOSPITAL_COMMUNITY): Payer: Self-pay

## 2024-06-30 ENCOUNTER — Other Ambulatory Visit: Payer: Self-pay

## 2024-07-07 ENCOUNTER — Other Ambulatory Visit: Payer: Self-pay

## 2024-07-07 ENCOUNTER — Other Ambulatory Visit (HOSPITAL_COMMUNITY): Payer: Self-pay

## 2024-07-31 ENCOUNTER — Encounter: Payer: Self-pay | Admitting: Medical-Surgical

## 2024-07-31 DIAGNOSIS — F909 Attention-deficit hyperactivity disorder, unspecified type: Secondary | ICD-10-CM

## 2024-08-01 ENCOUNTER — Other Ambulatory Visit (HOSPITAL_COMMUNITY): Payer: Self-pay

## 2024-08-01 ENCOUNTER — Other Ambulatory Visit: Payer: Self-pay

## 2024-08-01 MED ORDER — ESCITALOPRAM OXALATE 10 MG PO TABS
10.0000 mg | ORAL_TABLET | Freq: Every day | ORAL | 1 refills | Status: AC
  Filled 2024-08-01 – 2024-08-07 (×2): qty 90, 90d supply, fill #0

## 2024-08-01 MED ORDER — AMPHETAMINE-DEXTROAMPHETAMINE 20 MG PO TABS
20.0000 mg | ORAL_TABLET | Freq: Two times a day (BID) | ORAL | 0 refills | Status: DC
  Filled 2024-08-01 – 2024-08-04 (×2): qty 60, 30d supply, fill #0

## 2024-08-04 ENCOUNTER — Other Ambulatory Visit: Payer: Self-pay

## 2024-08-04 ENCOUNTER — Other Ambulatory Visit (HOSPITAL_COMMUNITY): Payer: Self-pay

## 2024-08-07 ENCOUNTER — Other Ambulatory Visit (HOSPITAL_COMMUNITY): Payer: Self-pay

## 2024-08-12 DIAGNOSIS — G4733 Obstructive sleep apnea (adult) (pediatric): Secondary | ICD-10-CM | POA: Diagnosis not present

## 2024-08-20 ENCOUNTER — Encounter: Payer: Self-pay | Admitting: Emergency Medicine

## 2024-08-20 ENCOUNTER — Ambulatory Visit
Admission: EM | Admit: 2024-08-20 | Discharge: 2024-08-20 | Disposition: A | Discharge status: Home / Self Care | Source: Home / Self Care | Attending: Family Medicine | Admitting: Family Medicine

## 2024-08-20 DIAGNOSIS — J019 Acute sinusitis, unspecified: Secondary | ICD-10-CM | POA: Diagnosis not present

## 2024-08-20 MED ORDER — AMOXICILLIN-POT CLAVULANATE 875-125 MG PO TABS: 1.0000 | ORAL_TABLET | Freq: Two times a day (BID) | ORAL | 0 refills | Status: DC

## 2024-08-20 NOTE — ED Triage Notes (Signed)
 Patient c/o significant sinus/facial pain x 3 weeks, nasal drainage, pressure, sore throat.  Patient has taken Sudafed, Mucinex and anti-histamine.

## 2024-08-20 NOTE — Discharge Instructions (Signed)
 Hold Flonase  for now until nosebleed stopped.  You can decide whether you needed again after treatment Take the Augmentin 2 times a day for 7 days.  Take with food Continue your sinus rinses See your doctor if not improving by next week

## 2024-08-20 NOTE — ED Provider Notes (Signed)
 " Stephen Steele    CSN: [id number] Arrival date & time: 08/20/24  1232      History   Chief Complaint Chief Complaint  Patient presents with   Sinus Pain    HPI Stephen Steele is a 33 y.o. male.   HPI  Patient has had symptoms consistent with sinus infection for 3 weeks.  Runny stuffy nose.  Postnasal drip.  Sore throat.  Face pressure and pain.  Slight cough.  He is having daily nosebleeds.  Takes Flonase  daily.  Also does sinus rinses  Past Medical History:  Diagnosis Date   Acute bilateral low back pain without sciatica 07/24/2017   Adult ADHD 04/13/2017   Allergy    Chronic left shoulder pain    Hypertension 04/13/2017   Mixed hyperlipidemia 04/16/2017   Obesity    Pleural effusion on right 10/25/2017   Sleep apnea Late 2019-Early 2020   managed with CPaP   Tachycardia with heart rate 100-120 beats per minute 01/02/2018    Patient Active Problem List   Diagnosis Date Noted   GAD (generalized anxiety disorder) 04/22/2024   Current severe episode of major depressive disorder without psychotic features without prior episode (HCC) 04/22/2024   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 11/07/2023   Ocular pain, left eye 09/22/2023   Hypersomnolence 02/04/2019   OSA on CPAP 02/04/2019   Chronic bilateral low back pain without sciatica 02/04/2019   Pulmonary nodules 07/29/2018   Allergic rhinitis with postnasal drip 04/08/2018   Mixed hyperlipidemia 04/16/2017   Adult ADHD 04/13/2017   Class 1 obesity due to excess calories with serious comorbidity and body mass index (BMI) of 30.0 to 30.9 in adult 04/13/2017   Essential (primary) hypertension 04/13/2017    Past Surgical History:  Procedure Laterality Date   FRACTURE SURGERY  2016   right hand; 3rd, 4th, 5th metacarpal fracture   HAND SURGERY Right    TONSILLECTOMY  1997       Home Medications    Prior to Admission medications  Medication Sig Start Date End Date Taking? Authorizing Provider   AMBULATORY NON FORMULARY MEDICATION Supply ordered: CPAP and other supplies needed (headgear, cushions, filters, heated tuubing and water chamber) Dx: obstructive sleep apnea Settings: auto-titration 5-20 cmH2O 10/26/20  Yes Alexander, Natalie, DO  amoxicillin -clavulanate (AUGMENTIN) 875-125 MG tablet Take 1 tablet by mouth every 12 (twelve) hours. 08/20/24  Yes Maranda Jamee Jacob, MD  amphetamine -dextroamphetamine  (ADDERALL) 20 MG tablet Take 1 tablet (20 mg total) by mouth 2 (two) times daily. 05/10/24  Yes Willo Mini, NP  amphetamine -dextroamphetamine  (ADDERALL) 20 MG tablet Take 1 tablet (20 mg total) by mouth 2 (two) times daily. 04/10/24  Yes Willo Mini, NP  amphetamine -dextroamphetamine  (ADDERALL) 20 MG tablet Take 1 tablet (20 mg total) by mouth 2 (two) times daily. 08/01/24  Yes Willo Mini, NP  atorvastatin  (LIPITOR) 40 MG tablet Take 1 tablet (40 mg total) by mouth daily. 03/11/24  Yes Willo Mini, NP  escitalopram  (LEXAPRO ) 10 MG tablet Take 1/2 tablet (5 mg) by mouth daily for 1 week then increase to 1 full tablet (10 mg) daily. 08/01/24  Yes Willo Mini, NP  fluticasone  (FLONASE ) 50 MCG/ACT nasal spray Place 1 spray into both nostrils daily. 08/23/23  Yes Willo Mini, NP  lisinopril  (ZESTRIL ) 20 MG tablet Take 1 tablet (20 mg total) by mouth daily. 03/11/24  Yes Willo Mini, NP  montelukast  (SINGULAIR ) 10 MG tablet Take 1 tablet (10 mg total) by mouth daily. 03/11/24  Yes Jessup,  Joy, NP  tacrolimus  (PROTOPIC ) 0.03 % ointment Apply 1 Application topically 2 (two) times daily. 11/09/23  Yes   tiZANidine  (ZANAFLEX ) 4 MG tablet Take 1 tablet by mouth at bedtime as needed for muscle spasms. 03/11/24  Yes Willo Mini, NP    Family History Family History  Problem Relation Age of Onset   Depression Mother    Hypercholesterolemia Father    Depression Sister    ADD / ADHD Sister    Stroke Paternal Grandfather    Cancer Paternal Grandfather    Heart attack Paternal Grandfather    Heart disease  Paternal Grandfather    Diabetes Maternal Aunt     Social History Social History[1]   Allergies   Patient has no known allergies.   Review of Systems Review of Systems See HPI  Physical Exam Triage Vital Signs ED Triage Vitals  Encounter Vitals Group     BP 08/20/24 1401 121/76     Girls Systolic BP Percentile --      Girls Diastolic BP Percentile --      Boys Systolic BP Percentile --      Boys Diastolic BP Percentile --      Pulse Rate 08/20/24 1401 90     Resp 08/20/24 1401 18     Temp 08/20/24 1401 98.9 F (37.2 C)     Temp Source 08/20/24 1401 Oral     SpO2 08/20/24 1401 99 %     Weight 08/20/24 1400 180 lb (81.6 kg)     Height 08/20/24 1400 5' 8 (1.727 m)     Head Circumference --      Peak Flow --      Pain Score 08/20/24 1426 0     Pain Loc --      Pain Education --      Exclude from Growth Chart --    No data found.  Updated Vital Signs BP 121/76 (BP Location: Right Arm)   Pulse 90   Temp 98.9 F (37.2 C) (Oral)   Resp 18   Ht 5' 8 (1.727 m)   Wt 81.6 kg   SpO2 99%   BMI 27.37 kg/m      Physical Exam Constitutional:      General: He is not in acute distress.    Appearance: He is well-developed. He is not ill-appearing.  HENT:     Head: Normocephalic and atraumatic.     Right Ear: Tympanic membrane normal.     Left Ear: Tympanic membrane normal.     Nose: Congestion and rhinorrhea present.     Comments: Facial sinuses are tender    Mouth/Throat:     Pharynx: Posterior oropharyngeal erythema present.  Eyes:     Conjunctiva/sclera: Conjunctivae normal.     Pupils: Pupils are equal, round, and reactive to light.  Cardiovascular:     Rate and Rhythm: Normal rate and regular rhythm.     Heart sounds: Normal heart sounds.  Pulmonary:     Effort: Pulmonary effort is normal. No respiratory distress.     Breath sounds: Normal breath sounds.  Musculoskeletal:        General: Normal range of motion.     Cervical back: Normal range of  motion.  Lymphadenopathy:     Cervical: No cervical adenopathy.  Skin:    General: Skin is warm and dry.  Neurological:     Mental Status: He is alert.      UC Treatments / Results  Labs (all labs ordered  are listed, but only abnormal results are displayed) Labs Reviewed - No data to display  EKG   Radiology No results found.  Procedures Procedures (including critical Steele time)  Medications Ordered in UC Medications - No data to display  Initial Impression / Assessment and Plan / UC Course  I have reviewed the triage vital signs and the nursing notes.  Pertinent labs & imaging results that were available during my Steele of the patient were reviewed by me and considered in my medical decision making (see chart for details).     Final Clinical Impressions(s) / UC Diagnoses   Final diagnoses:  Acute sinusitis with symptoms greater than 10 days     Discharge Instructions      Hold Flonase  for now until nosebleed stopped.  You can decide whether you needed again after treatment Take the Augmentin 2 times a day for 7 days.  Take with food Continue your sinus rinses See your doctor if not improving by next week   ED Prescriptions     Medication Sig Dispense Auth. Provider   amoxicillin -clavulanate (AUGMENTIN) 875-125 MG tablet Take 1 tablet by mouth every 12 (twelve) hours. 14 tablet Maranda Jamee Jacob, MD      PDMP not reviewed this encounter.    [1]  Social History Tobacco Use   Smoking status: Former    Current packs/day: 0.00    Average packs/day: 0.5 packs/day for 2.0 years (1.0 ttl pk-yrs)    Types: Cigarettes, E-cigarettes    Start date: 04/13/2010    Quit date: 04/13/2012    Years since quitting: 12.3   Smokeless tobacco: Never   Tobacco comments:    Successfully transitioned from tobacco to E-cigarettes in 2013. Discontinued vaping in April 2022.  Vaping Use   Vaping status: Former  Substance Use Topics   Alcohol use: Not Currently   Drug  use: Not Currently     Maranda Jamee Jacob, MD 08/20/24 2008  "

## 2024-08-21 ENCOUNTER — Encounter: Payer: Self-pay | Admitting: Medical-Surgical

## 2024-08-22 MED ORDER — METHYLPREDNISOLONE 4 MG PO TBPK: ORAL_TABLET | ORAL | 0 refills | Status: DC

## 2024-09-10 ENCOUNTER — Other Ambulatory Visit: Payer: Self-pay | Admitting: Medical-Surgical

## 2024-09-10 ENCOUNTER — Other Ambulatory Visit: Payer: Self-pay

## 2024-09-10 ENCOUNTER — Other Ambulatory Visit (HOSPITAL_COMMUNITY): Payer: Self-pay

## 2024-09-10 DIAGNOSIS — R0982 Postnasal drip: Secondary | ICD-10-CM

## 2024-09-10 MED ORDER — FLUTICASONE PROPIONATE 50 MCG/ACT NA SUSP
1.0000 | Freq: Every day | NASAL | 0 refills | Status: AC
  Filled 2024-09-10: qty 16, 60d supply, fill #0

## 2024-09-11 ENCOUNTER — Other Ambulatory Visit (HOSPITAL_COMMUNITY): Payer: Self-pay

## 2024-09-11 ENCOUNTER — Other Ambulatory Visit: Payer: Self-pay

## 2024-09-11 ENCOUNTER — Ambulatory Visit: Admitting: Medical-Surgical

## 2024-09-11 VITALS — BP 106/66 | HR 93 | Temp 98.7°F | Resp 20 | Ht 68.0 in | Wt 185.1 lb

## 2024-09-11 DIAGNOSIS — G4733 Obstructive sleep apnea (adult) (pediatric): Secondary | ICD-10-CM | POA: Diagnosis not present

## 2024-09-11 DIAGNOSIS — I10 Essential (primary) hypertension: Secondary | ICD-10-CM

## 2024-09-11 DIAGNOSIS — F324 Major depressive disorder, single episode, in partial remission: Secondary | ICD-10-CM

## 2024-09-11 DIAGNOSIS — F411 Generalized anxiety disorder: Secondary | ICD-10-CM

## 2024-09-11 DIAGNOSIS — Z23 Encounter for immunization: Secondary | ICD-10-CM

## 2024-09-11 DIAGNOSIS — E782 Mixed hyperlipidemia: Secondary | ICD-10-CM

## 2024-09-11 DIAGNOSIS — Z131 Encounter for screening for diabetes mellitus: Secondary | ICD-10-CM

## 2024-09-11 DIAGNOSIS — Z1329 Encounter for screening for other suspected endocrine disorder: Secondary | ICD-10-CM | POA: Diagnosis not present

## 2024-09-11 DIAGNOSIS — F909 Attention-deficit hyperactivity disorder, unspecified type: Secondary | ICD-10-CM

## 2024-09-11 DIAGNOSIS — F322 Major depressive disorder, single episode, severe without psychotic features: Secondary | ICD-10-CM

## 2024-09-11 DIAGNOSIS — J329 Chronic sinusitis, unspecified: Secondary | ICD-10-CM | POA: Diagnosis not present

## 2024-09-11 MED ORDER — AMPHETAMINE-DEXTROAMPHETAMINE 20 MG PO TABS: 20.0000 mg | ORAL_TABLET | Freq: Two times a day (BID) | ORAL | 0 refills | Status: AC

## 2024-09-11 MED ORDER — PREDNISONE 50 MG PO TABS
50.0000 mg | ORAL_TABLET | Freq: Every day | ORAL | 0 refills | Status: AC
  Filled 2024-09-11: qty 5, 5d supply, fill #0

## 2024-09-11 MED ORDER — AMPHETAMINE-DEXTROAMPHETAMINE 20 MG PO TABS
20.0000 mg | ORAL_TABLET | Freq: Two times a day (BID) | ORAL | 0 refills | Status: AC
  Filled 2024-09-11: qty 60, 30d supply, fill #0

## 2024-09-11 MED ORDER — IPRATROPIUM BROMIDE 0.03 % NA SOLN
2.0000 | Freq: Two times a day (BID) | NASAL | 0 refills | Status: AC | PRN
  Filled 2024-09-11: qty 30, 30d supply, fill #0

## 2024-09-11 NOTE — Progress Notes (Signed)
 "       Established patient visit   History of Present Illness   Discussed the use of AI scribe software for clinical note transcription with the patient, who gave verbal consent to proceed.  History of Present Illness   Stephen Steele is a 34 year old male who presents with chronic congestion.  Nasal congestion and sinus symptoms - Severe nasal congestion since before Christmas - Intermittent facial pressure, worsens with bending over - No dental pain, but persistent facial pressure - Completed Augmentin  in early January for presumed sinus infection, with approximately one week of relief before recurrence of symptoms - Discontinued Flonase  due to nosebleeds during infection - Uses saline rinses several times daily, but remains congested - Prior methylprednisolone  taper provided little benefit  Attention deficit hyperactivity disorder (adhd) - ADHD treated with Adderall 20 mg twice daily since age 44 - Symptoms controlled without noticeable side effects or sleep disturbance - Requests refill of Adderall due to expired prescription  Anxiety  - Anxiety and depression well-controlled on Lexapro  10mg  daily - No suicidal or homicidal ideation  Obstructive sleep apnea - Uses CPAP for sleep apnea, compliant with use - CPAP supplies are adequate  Chronic back pain - Chronic back pain currently manageable with prn tizanidine   Physical Exam   Physical Exam Vitals and nursing note reviewed.  Constitutional:      General: He is not in acute distress.    Appearance: Normal appearance. He is not ill-appearing.  HENT:     Head: Normocephalic and atraumatic.     Right Ear: Tympanic membrane, ear canal and external ear normal. There is no impacted cerumen.     Left Ear: Tympanic membrane, ear canal and external ear normal. There is no impacted cerumen.     Nose: Congestion present.     Right Sinus: Maxillary sinus tenderness and frontal sinus tenderness present.     Left Sinus:  Maxillary sinus tenderness and frontal sinus tenderness present.     Mouth/Throat:     Mouth: Mucous membranes are moist.     Pharynx: No posterior oropharyngeal erythema.  Eyes:     General: No scleral icterus.       Right eye: No discharge.        Left eye: No discharge.     Extraocular Movements: Extraocular movements intact.     Conjunctiva/sclera: Conjunctivae normal.     Pupils: Pupils are equal, round, and reactive to light.  Cardiovascular:     Rate and Rhythm: Normal rate and regular rhythm.     Pulses: Normal pulses.     Heart sounds: Normal heart sounds. No murmur heard.    No friction rub. No gallop.  Pulmonary:     Effort: Pulmonary effort is normal. No respiratory distress.     Breath sounds: Normal breath sounds.  Skin:    General: Skin is warm and dry.  Neurological:     Mental Status: He is alert and oriented to person, place, and time.  Psychiatric:        Mood and Affect: Mood normal.        Behavior: Behavior normal.        Thought Content: Thought content normal.        Judgment: Judgment normal.    Assessment & Plan   Chronic sinusitis Persistent symptoms with previous inadequate response to methylprednisolone . Considering prednisone  for persistent symptoms and brain fog. - Prescribed prednisone  50 mg daily in the morning with breakfast. - Prescribed Atrovent   nasal spray twice daily as needed. - Advised to contact if symptoms do not improve after prednisone  course for potential further antibiotic treatment.  Attention-deficit hyperactivity disorder ADHD well-managed long-term on Adderall 20 mg twice daily without side effects. - Refilled Adderall prescription.  Hypertension BP well-controlled on Lisinopril  20mg  daily. At goal today.  - Continue Lisinopril  as prescribed. - Updating labs.   GAD/Major depressive disorder with single episode, in partial remission  Well-controlled with Lexapro . No concerns, SI, or HI. - Continue Lexapro  10mg  daily.    OSA on CPAP - Continue CPAP use.  Mixed hyperlipidemia - Continue Lipitor 40mg  daily.  - Updating labs.   Diabetes mellitus screening Family history of diabetes. Screening due every 2-3 years. - Ordered A1c.   Thyroid  disorder screening No family history of thyroid  disorders. Screening due every 2-3 years. - Ordered TSH.   Follow up   Return in about 6 months (around 03/11/2025) for ADHD follow up. __________________________________ Zada FREDRIK Palin, DNP, APRN, FNP-BC Primary Care and Sports Medicine [facility] [place] "

## 2024-09-12 ENCOUNTER — Other Ambulatory Visit (HOSPITAL_COMMUNITY): Payer: Self-pay

## 2024-09-12 ENCOUNTER — Ambulatory Visit: Payer: Self-pay | Admitting: Medical-Surgical

## 2024-09-12 LAB — CMP14+EGFR
ALT: 37 IU/L (ref 0–44)
AST: 23 IU/L (ref 0–40)
Albumin: 4.7 g/dL (ref 4.1–5.1)
Alkaline Phosphatase: 86 IU/L (ref 47–123)
BUN/Creatinine Ratio: 15 (ref 9–20)
BUN: 13 mg/dL (ref 6–20)
Bilirubin Total: 0.5 mg/dL (ref 0.0–1.2)
CO2: 24 mmol/L (ref 20–29)
Calcium: 9.4 mg/dL (ref 8.7–10.2)
Chloride: 101 mmol/L (ref 96–106)
Creatinine, Ser: 0.89 mg/dL (ref 0.76–1.27)
Globulin, Total: 1.8 g/dL (ref 1.5–4.5)
Glucose: 77 mg/dL (ref 70–99)
Potassium: 4.1 mmol/L (ref 3.5–5.2)
Sodium: 139 mmol/L (ref 134–144)
Total Protein: 6.5 g/dL (ref 6.0–8.5)
eGFR: 117 mL/min/1.73

## 2024-09-12 LAB — CBC WITH DIFFERENTIAL/PLATELET
Basophils Absolute: 0.1 x10E3/uL (ref 0.0–0.2)
Basos: 1 %
EOS (ABSOLUTE): 0.1 x10E3/uL (ref 0.0–0.4)
Eos: 2 %
Hematocrit: 42.7 % (ref 37.5–51.0)
Hemoglobin: 14.1 g/dL (ref 13.0–17.7)
Immature Grans (Abs): 0 x10E3/uL (ref 0.0–0.1)
Immature Granulocytes: 0 %
Lymphocytes Absolute: 0.8 x10E3/uL (ref 0.7–3.1)
Lymphs: 17 %
MCH: 30.9 pg (ref 26.6–33.0)
MCHC: 33 g/dL (ref 31.5–35.7)
MCV: 94 fL (ref 79–97)
Monocytes Absolute: 0.5 x10E3/uL (ref 0.1–0.9)
Monocytes: 12 %
Neutrophils Absolute: 3.1 x10E3/uL (ref 1.4–7.0)
Neutrophils: 68 %
Platelets: 226 x10E3/uL (ref 150–450)
RBC: 4.56 x10E6/uL (ref 4.14–5.80)
RDW: 12.4 % (ref 11.6–15.4)
WBC: 4.6 x10E3/uL (ref 3.4–10.8)

## 2024-09-12 LAB — LIPID PANEL
Chol/HDL Ratio: 3.5 ratio (ref 0.0–5.0)
Cholesterol, Total: 126 mg/dL (ref 100–199)
HDL: 36 mg/dL — ABNORMAL LOW
LDL Chol Calc (NIH): 77 mg/dL (ref 0–99)
Triglycerides: 59 mg/dL (ref 0–149)
VLDL Cholesterol Cal: 13 mg/dL (ref 5–40)

## 2024-09-12 LAB — TSH: TSH: 0.899 u[IU]/mL (ref 0.450–4.500)

## 2024-09-12 LAB — HEMOGLOBIN A1C
Est. average glucose Bld gHb Est-mCnc: 111 mg/dL
Hgb A1c MFr Bld: 5.5 % (ref 4.8–5.6)

## 2024-09-18 ENCOUNTER — Encounter: Payer: Self-pay | Admitting: Medical-Surgical

## 2024-09-18 ENCOUNTER — Other Ambulatory Visit: Payer: Self-pay

## 2024-09-18 MED ORDER — DOXYCYCLINE HYCLATE 100 MG PO TABS
100.0000 mg | ORAL_TABLET | Freq: Two times a day (BID) | ORAL | 0 refills | Status: AC
  Filled 2024-09-18: qty 14, 7d supply, fill #0

## 2025-03-13 ENCOUNTER — Ambulatory Visit: Admitting: Medical-Surgical
